# Patient Record
Sex: Female | Born: 1980 | Race: White | Hispanic: No | Marital: Single | Smoking: Never smoker
Health system: Southern US, Community
[De-identification: ages and names within clinical notes are randomized; demographics above are authoritative.]

## PROBLEM LIST (undated history)

## (undated) DIAGNOSIS — A599 Trichomoniasis, unspecified: Secondary | ICD-10-CM

## (undated) DIAGNOSIS — F32A Depression, unspecified: Secondary | ICD-10-CM

## (undated) DIAGNOSIS — Z87440 Personal history of urinary (tract) infections: Secondary | ICD-10-CM

## (undated) DIAGNOSIS — Z8619 Personal history of other infectious and parasitic diseases: Secondary | ICD-10-CM

## (undated) DIAGNOSIS — L409 Psoriasis, unspecified: Secondary | ICD-10-CM

## (undated) DIAGNOSIS — R51 Headache: Secondary | ICD-10-CM

## (undated) DIAGNOSIS — F329 Major depressive disorder, single episode, unspecified: Secondary | ICD-10-CM

## (undated) DIAGNOSIS — T4145XA Adverse effect of unspecified anesthetic, initial encounter: Secondary | ICD-10-CM

## (undated) DIAGNOSIS — F4321 Adjustment disorder with depressed mood: Secondary | ICD-10-CM

## (undated) DIAGNOSIS — IMO0002 Reserved for concepts with insufficient information to code with codable children: Secondary | ICD-10-CM

## (undated) DIAGNOSIS — I209 Angina pectoris, unspecified: Secondary | ICD-10-CM

## (undated) DIAGNOSIS — F4310 Post-traumatic stress disorder, unspecified: Secondary | ICD-10-CM

## (undated) DIAGNOSIS — T8859XA Other complications of anesthesia, initial encounter: Secondary | ICD-10-CM

## (undated) DIAGNOSIS — Z8669 Personal history of other diseases of the nervous system and sense organs: Secondary | ICD-10-CM

## (undated) DIAGNOSIS — B009 Herpesviral infection, unspecified: Secondary | ICD-10-CM

## (undated) DIAGNOSIS — R001 Bradycardia, unspecified: Secondary | ICD-10-CM

## (undated) DIAGNOSIS — Z8719 Personal history of other diseases of the digestive system: Secondary | ICD-10-CM

## (undated) DIAGNOSIS — F419 Anxiety disorder, unspecified: Secondary | ICD-10-CM

## (undated) DIAGNOSIS — Z634 Disappearance and death of family member: Secondary | ICD-10-CM

## (undated) DIAGNOSIS — D649 Anemia, unspecified: Secondary | ICD-10-CM

## (undated) HISTORY — DX: Headache: R51

## (undated) HISTORY — DX: Personal history of urinary (tract) infections: Z87.440

## (undated) HISTORY — DX: Other complications of anesthesia, initial encounter: T88.59XA

## (undated) HISTORY — DX: Depression, unspecified: F32.A

## (undated) HISTORY — DX: Reserved for concepts with insufficient information to code with codable children: IMO0002

## (undated) HISTORY — DX: Personal history of other infectious and parasitic diseases: Z86.19

## (undated) HISTORY — DX: Major depressive disorder, single episode, unspecified: F32.9

## (undated) HISTORY — DX: Herpesviral infection, unspecified: B00.9

## (undated) HISTORY — DX: Disappearance and death of family member: Z63.4

## (undated) HISTORY — DX: Trichomoniasis, unspecified: A59.9

## (undated) HISTORY — PX: WISDOM TOOTH EXTRACTION: SHX21

## (undated) HISTORY — DX: Adjustment disorder with depressed mood: F43.21

## (undated) HISTORY — DX: Adverse effect of unspecified anesthetic, initial encounter: T41.45XA

## (undated) HISTORY — DX: Anemia, unspecified: D64.9

## (undated) HISTORY — DX: Personal history of other diseases of the digestive system: Z87.19

---
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
## 2001-10-26 HISTORY — PX: DILATION AND CURETTAGE OF UTERUS: SHX78

## 2002-12-26 ENCOUNTER — Ambulatory Visit (HOSPITAL_COMMUNITY): Admission: RE | Admit: 2002-12-26 | Discharge: 2002-12-26 | Payer: Self-pay | Admitting: Obstetrics and Gynecology

## 2002-12-26 ENCOUNTER — Encounter: Payer: Self-pay | Admitting: Obstetrics and Gynecology

## 2003-03-22 ENCOUNTER — Inpatient Hospital Stay (HOSPITAL_COMMUNITY): Admission: AD | Admit: 2003-03-22 | Discharge: 2003-03-22 | Payer: Self-pay | Admitting: Obstetrics and Gynecology

## 2003-05-28 ENCOUNTER — Inpatient Hospital Stay (HOSPITAL_COMMUNITY): Admission: EM | Admit: 2003-05-28 | Discharge: 2003-05-30 | Payer: Self-pay | Admitting: Obstetrics and Gynecology

## 2011-08-21 LAB — OB RESULTS CONSOLE ABO/RH

## 2011-08-21 LAB — RUBELLA ANTIBODY, IGM: Rubella: IMMUNE

## 2011-08-21 LAB — OB RESULTS CONSOLE RPR
RPR: NONREACTIVE
RPR: NONREACTIVE

## 2011-08-21 LAB — HIV ANTIBODY (ROUTINE TESTING W REFLEX): HIV: NONREACTIVE

## 2011-10-27 NOTE — L&D Delivery Note (Signed)
Delivery Note Pt's labor has progressed spontaneously without augmentation since admission.  Cx 8 cm around 0445.  Pt did receive a dose of Stadol and a dose of Fentanyl during labor.  Ant rim noted on Rt side around 0515, and it reduced easily after voluntary pushing started.  Cx complete at 0520.  Pushed well to SVD at 5:36 AM.  A viable female "[name]" was delivered via Vaginal, Spontaneous Delivery (Presentation: Left Occiput Anterior). Slight delay in shoulders, but reduced w/ HOB down and McRoberts.  Newborn immediately placed on mom's abdomen, where dried and stimulated, and spontaneous cry thereafter.  APGAR: 9, 9; weight 8 lb 10 oz (3912 g).   Placenta status: Intact, Spontaneous, Tomasa Blase; [place] cord insertion.  Cord: 3 vessels with the following complications: None.  Cord pH: n/a  Anesthesia: None  Episiotomy: None Lacerations: None Suture Repair: n/a Est. Blood Loss (mL): 250  Mom to postpartum.  Baby to nursery-stable. Pt may be interested in early discharge home tomorrow.    Sharon Mckay H 02/29/2012, 6:05 AM

## 2011-12-04 ENCOUNTER — Other Ambulatory Visit: Payer: Self-pay | Admitting: Obstetrics and Gynecology

## 2011-12-04 DIAGNOSIS — Z6791 Unspecified blood type, Rh negative: Secondary | ICD-10-CM

## 2011-12-06 ENCOUNTER — Inpatient Hospital Stay (HOSPITAL_COMMUNITY)
Admission: AD | Admit: 2011-12-06 | Discharge: 2011-12-06 | Disposition: A | Discharge status: Home / Self Care | Payer: Medicaid Other | Source: Ambulatory Visit | Attending: Obstetrics and Gynecology | Admitting: Obstetrics and Gynecology

## 2011-12-06 DIAGNOSIS — Z298 Encounter for other specified prophylactic measures: Secondary | ICD-10-CM | POA: Insufficient documentation

## 2011-12-06 DIAGNOSIS — Z2989 Encounter for other specified prophylactic measures: Secondary | ICD-10-CM | POA: Insufficient documentation

## 2011-12-06 MED ORDER — RHO D IMMUNE GLOBULIN 1500 UNIT/2ML IJ SOLN
300.0000 ug | Freq: Once | INTRAMUSCULAR | Status: AC
  Administered 2011-12-06: 300 ug via INTRAMUSCULAR
  Filled 2011-12-06: qty 2

## 2011-12-06 MED ORDER — RHO D IMMUNE GLOBULIN 300 MCG IM INJ: 300.0000 ug | INJECTION | Freq: Once | INTRAMUSCULAR | Status: DC

## 2011-12-06 NOTE — Progress Notes (Signed)
Pt here for rhyfolac work up

## 2011-12-07 LAB — RH IG WORKUP (INCLUDES ABO/RH)
ABO/RH(D): A NEG
Antibody Screen: NEGATIVE
Unit division: 0

## 2011-12-09 MED ORDER — RHO D IMMUNE GLOBULIN 1500 UNIT/2ML IJ SOLN: 300.0000 ug | Freq: Once | INTRAMUSCULAR | Status: AC

## 2011-12-25 ENCOUNTER — Encounter (INDEPENDENT_AMBULATORY_CARE_PROVIDER_SITE_OTHER): Payer: Medicaid Other

## 2011-12-25 DIAGNOSIS — Z331 Pregnant state, incidental: Secondary | ICD-10-CM

## 2011-12-30 ENCOUNTER — Encounter (INDEPENDENT_AMBULATORY_CARE_PROVIDER_SITE_OTHER): Payer: Medicaid Other | Admitting: Obstetrics and Gynecology

## 2011-12-30 DIAGNOSIS — R87610 Atypical squamous cells of undetermined significance on cytologic smear of cervix (ASC-US): Secondary | ICD-10-CM

## 2012-01-08 ENCOUNTER — Encounter (INDEPENDENT_AMBULATORY_CARE_PROVIDER_SITE_OTHER): Payer: Medicaid Other | Admitting: Obstetrics and Gynecology

## 2012-01-08 DIAGNOSIS — Z23 Encounter for immunization: Secondary | ICD-10-CM

## 2012-01-08 DIAGNOSIS — Z331 Pregnant state, incidental: Secondary | ICD-10-CM

## 2012-01-21 ENCOUNTER — Encounter (INDEPENDENT_AMBULATORY_CARE_PROVIDER_SITE_OTHER): Payer: Medicaid Other | Admitting: Obstetrics and Gynecology

## 2012-01-21 DIAGNOSIS — Z331 Pregnant state, incidental: Secondary | ICD-10-CM

## 2012-01-29 ENCOUNTER — Encounter (INDEPENDENT_AMBULATORY_CARE_PROVIDER_SITE_OTHER): Payer: Medicaid Other | Admitting: Registered Nurse

## 2012-01-29 ENCOUNTER — Other Ambulatory Visit: Payer: Self-pay | Admitting: Registered Nurse

## 2012-01-29 DIAGNOSIS — Z331 Pregnant state, incidental: Secondary | ICD-10-CM

## 2012-01-29 DIAGNOSIS — Z202 Contact with and (suspected) exposure to infections with a predominantly sexual mode of transmission: Secondary | ICD-10-CM

## 2012-01-29 LAB — STREP B DNA PROBE: GBS: NEGATIVE

## 2012-02-01 LAB — HSV 1 ANTIBODY, IGG: HSV 1 Glycoprotein G Ab, IgG: 2.1 IV — ABNORMAL HIGH

## 2012-02-01 LAB — HSV 2 ANTIBODY, IGG: HSV 2 Glycoprotein G Ab, IgG: 2.22 IV — ABNORMAL HIGH

## 2012-02-05 ENCOUNTER — Ambulatory Visit (INDEPENDENT_AMBULATORY_CARE_PROVIDER_SITE_OTHER): Payer: Medicaid Other | Admitting: Registered Nurse

## 2012-02-05 ENCOUNTER — Encounter: Payer: Self-pay | Admitting: Registered Nurse

## 2012-02-05 VITALS — BP 104/62 | Ht 67.0 in | Wt 239.0 lb

## 2012-02-05 DIAGNOSIS — B009 Herpesviral infection, unspecified: Secondary | ICD-10-CM | POA: Insufficient documentation

## 2012-02-05 DIAGNOSIS — Z6711 Type A blood, Rh negative: Secondary | ICD-10-CM

## 2012-02-05 DIAGNOSIS — Z789 Other specified health status: Secondary | ICD-10-CM

## 2012-02-05 DIAGNOSIS — R87811 Vaginal high risk human papillomavirus (HPV) DNA test positive: Secondary | ICD-10-CM

## 2012-02-05 DIAGNOSIS — Z2839 Other underimmunization status: Secondary | ICD-10-CM | POA: Insufficient documentation

## 2012-02-05 DIAGNOSIS — IMO0002 Reserved for concepts with insufficient information to code with codable children: Secondary | ICD-10-CM

## 2012-02-05 DIAGNOSIS — Z283 Underimmunization status: Secondary | ICD-10-CM | POA: Insufficient documentation

## 2012-02-05 DIAGNOSIS — Z331 Pregnant state, incidental: Secondary | ICD-10-CM

## 2012-02-05 DIAGNOSIS — O09899 Supervision of other high risk pregnancies, unspecified trimester: Secondary | ICD-10-CM

## 2012-02-05 MED ORDER — VALACYCLOVIR HCL 1 G PO TABS: 1000.0000 mg | ORAL_TABLET | Freq: Every day | ORAL | Status: DC

## 2012-02-05 NOTE — Progress Notes (Addendum)
Complains of increased pressure. Denies other complaints. 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 Discussed need for cesarean section if outbreak within 10 days of going in to labor. Understanding verbalized. U/S @ NV for growth s>d.

## 2012-02-05 NOTE — Progress Notes (Signed)
Pt states this morning it was very hard for her to sit up and a great amount of pressure was in her lower abdomen, but after she walked around pressed ceased. Pt states she has no concerns. Pt declines cervix check.

## 2012-02-11 DIAGNOSIS — B977 Papillomavirus as the cause of diseases classified elsewhere: Secondary | ICD-10-CM | POA: Insufficient documentation

## 2012-02-11 DIAGNOSIS — Z6711 Type A blood, Rh negative: Secondary | ICD-10-CM

## 2012-02-12 ENCOUNTER — Encounter: Payer: Self-pay | Admitting: Obstetrics and Gynecology

## 2012-02-12 ENCOUNTER — Encounter: Payer: Medicaid Other | Admitting: Obstetrics and Gynecology

## 2012-02-12 ENCOUNTER — Other Ambulatory Visit: Payer: Self-pay | Admitting: Registered Nurse

## 2012-02-12 ENCOUNTER — Ambulatory Visit (INDEPENDENT_AMBULATORY_CARE_PROVIDER_SITE_OTHER): Payer: Medicaid Other | Admitting: Obstetrics and Gynecology

## 2012-02-12 ENCOUNTER — Ambulatory Visit (INDEPENDENT_AMBULATORY_CARE_PROVIDER_SITE_OTHER): Payer: Medicaid Other

## 2012-02-12 VITALS — BP 106/64 | Wt 241.0 lb

## 2012-02-12 DIAGNOSIS — Z331 Pregnant state, incidental: Secondary | ICD-10-CM

## 2012-02-12 DIAGNOSIS — O09899 Supervision of other high risk pregnancies, unspecified trimester: Secondary | ICD-10-CM

## 2012-02-12 DIAGNOSIS — R87811 Vaginal high risk human papillomavirus (HPV) DNA test positive: Secondary | ICD-10-CM

## 2012-02-12 DIAGNOSIS — IMO0002 Reserved for concepts with insufficient information to code with codable children: Secondary | ICD-10-CM

## 2012-02-12 DIAGNOSIS — Z283 Underimmunization status: Secondary | ICD-10-CM

## 2012-02-12 NOTE — Progress Notes (Signed)
No complaints  Ultrasound shows:  EFW: 7 lbs 10 oz  66%           AFI: normal           Cervical length: not done              Fetal presentation: cephalic

## 2012-02-12 NOTE — Progress Notes (Signed)
Pt. Stated no issues today.  

## 2012-02-19 ENCOUNTER — Ambulatory Visit (INDEPENDENT_AMBULATORY_CARE_PROVIDER_SITE_OTHER): Payer: Medicaid Other | Admitting: Obstetrics and Gynecology

## 2012-02-19 ENCOUNTER — Encounter: Payer: Self-pay | Admitting: Obstetrics and Gynecology

## 2012-02-19 VITALS — BP 110/80 | Wt 239.0 lb

## 2012-02-19 DIAGNOSIS — Z331 Pregnant state, incidental: Secondary | ICD-10-CM

## 2012-02-19 NOTE — Progress Notes (Signed)
No complaints

## 2012-02-25 ENCOUNTER — Ambulatory Visit (INDEPENDENT_AMBULATORY_CARE_PROVIDER_SITE_OTHER): Payer: Medicaid Other | Admitting: Obstetrics and Gynecology

## 2012-02-25 VITALS — BP 110/72 | Wt 244.0 lb

## 2012-02-25 DIAGNOSIS — Z01419 Encounter for gynecological examination (general) (routine) without abnormal findings: Secondary | ICD-10-CM

## 2012-02-25 DIAGNOSIS — Z331 Pregnant state, incidental: Secondary | ICD-10-CM

## 2012-02-25 NOTE — Progress Notes (Signed)
Pt. Stated no issues today. Pt wants cervix check today.

## 2012-02-25 NOTE — Progress Notes (Signed)
Some pressure.No change in discharge.Reviewed R&B of IOL. Pt desires to wait for now. Follow-up in 1 week with [facility]

## 2012-02-26 ENCOUNTER — Encounter: Payer: Medicaid Other | Admitting: Obstetrics and Gynecology

## 2012-02-26 ENCOUNTER — Telehealth: Payer: Self-pay | Admitting: Obstetrics and Gynecology

## 2012-02-28 ENCOUNTER — Telehealth: Payer: Self-pay

## 2012-02-28 NOTE — Telephone Encounter (Signed)
Pt calling to report 40.3 weeks, and having irregular ctxs since 2000.  No LOF or VB.  GFM.  G4P2.  Reports cervix still closed on 5/2.  GBS neg.  Reports ctxs are every "5 to 7 to 10 min."  Offered MAU eval now, or continue observing at home until more regular and/or more painful.  She desires to continue to observe at home for now, but will f/u prn.   Has next appt Friday 5/10 if no spontaneous labor.

## 2012-02-29 ENCOUNTER — Encounter (HOSPITAL_COMMUNITY): Payer: Self-pay | Admitting: *Deleted

## 2012-02-29 ENCOUNTER — Inpatient Hospital Stay (HOSPITAL_COMMUNITY)
Admission: AD | Admit: 2012-02-29 | Discharge: 2012-03-02 | DRG: 774 | Disposition: A | Discharge status: Home / Self Care | Payer: Medicaid Other | Source: Ambulatory Visit | Attending: Obstetrics and Gynecology | Admitting: Obstetrics and Gynecology

## 2012-02-29 DIAGNOSIS — Z059 Observation and evaluation of newborn for unspecified suspected condition ruled out: Secondary | ICD-10-CM

## 2012-02-29 DIAGNOSIS — IMO0001 Reserved for inherently not codable concepts without codable children: Secondary | ICD-10-CM

## 2012-02-29 DIAGNOSIS — Z8669 Personal history of other diseases of the nervous system and sense organs: Secondary | ICD-10-CM

## 2012-02-29 DIAGNOSIS — O3440 Maternal care for other abnormalities of cervix, unspecified trimester: Secondary | ICD-10-CM

## 2012-02-29 DIAGNOSIS — D649 Anemia, unspecified: Secondary | ICD-10-CM | POA: Diagnosis not present

## 2012-02-29 DIAGNOSIS — Z0541 Observation and evaluation of newborn for suspected genetic condition ruled out: Secondary | ICD-10-CM

## 2012-02-29 DIAGNOSIS — IMO0002 Reserved for concepts with insufficient information to code with codable children: Secondary | ICD-10-CM | POA: Insufficient documentation

## 2012-02-29 DIAGNOSIS — O98519 Other viral diseases complicating pregnancy, unspecified trimester: Secondary | ICD-10-CM | POA: Diagnosis present

## 2012-02-29 DIAGNOSIS — A6000 Herpesviral infection of urogenital system, unspecified: Secondary | ICD-10-CM | POA: Diagnosis present

## 2012-02-29 DIAGNOSIS — Z9889 Other specified postprocedural states: Secondary | ICD-10-CM

## 2012-02-29 DIAGNOSIS — O9903 Anemia complicating the puerperium: Principal | ICD-10-CM | POA: Diagnosis not present

## 2012-02-29 HISTORY — DX: Personal history of other diseases of the nervous system and sense organs: Z86.69

## 2012-02-29 HISTORY — DX: Other specified postprocedural states: Z98.890

## 2012-02-29 HISTORY — DX: Reserved for concepts with insufficient information to code with codable children: IMO0002

## 2012-02-29 HISTORY — DX: Maternal care for other abnormalities of cervix, unspecified trimester: O34.40

## 2012-02-29 LAB — CBC
HCT: 34.2 % — ABNORMAL LOW (ref 36.0–46.0)
Hemoglobin: 11.3 g/dL — ABNORMAL LOW (ref 12.0–15.0)
MCHC: 33 g/dL (ref 30.0–36.0)
RDW: 14.3 % (ref 11.5–15.5)
WBC: 9 10*3/uL (ref 4.0–10.5)

## 2012-02-29 LAB — RPR: RPR Ser Ql: NONREACTIVE

## 2012-02-29 MED ORDER — IBUPROFEN 600 MG PO TABS
600.0000 mg | ORAL_TABLET | Freq: Four times a day (QID) | ORAL | Status: DC
  Administered 2012-02-29 – 2012-03-02 (×8): 600 mg via ORAL
  Filled 2012-02-29 (×8): qty 1

## 2012-02-29 MED ORDER — PRENATAL MULTIVITAMIN CH
1.0000 | ORAL_TABLET | Freq: Every day | ORAL | Status: DC
  Administered 2012-02-29 – 2012-03-02 (×3): 1 via ORAL
  Filled 2012-02-29 (×3): qty 1

## 2012-02-29 MED ORDER — SIMETHICONE 80 MG PO CHEW: 80.0000 mg | CHEWABLE_TABLET | ORAL | Status: DC | PRN

## 2012-02-29 MED ORDER — HYDROXYZINE HCL 50 MG/ML IM SOLN: 50.0000 mg | Freq: Four times a day (QID) | INTRAMUSCULAR | Status: DC | PRN

## 2012-02-29 MED ORDER — MEDROXYPROGESTERONE ACETATE 150 MG/ML IM SUSP: 150.0000 mg | INTRAMUSCULAR | Status: DC | PRN

## 2012-02-29 MED ORDER — LACTATED RINGERS IV SOLN
INTRAVENOUS | Status: DC
Start: 2012-02-29 — End: 2012-02-29
  Administered 2012-02-29: 02:00:00 via INTRAVENOUS

## 2012-02-29 MED ORDER — LIDOCAINE HCL (PF) 1 % IJ SOLN: 30.0000 mL | INTRAMUSCULAR | Status: DC | PRN

## 2012-02-29 MED ORDER — ONDANSETRON HCL 4 MG PO TABS: 4.0000 mg | ORAL_TABLET | ORAL | Status: DC | PRN

## 2012-02-29 MED ORDER — OXYTOCIN 20 UNITS IN LACTATED RINGERS INFUSION - SIMPLE
125.0000 mL/h | Freq: Once | INTRAVENOUS | Status: AC
  Administered 2012-02-29: 125 mL/h via INTRAVENOUS

## 2012-02-29 MED ORDER — ZOLPIDEM TARTRATE 5 MG PO TABS: 5.0000 mg | ORAL_TABLET | Freq: Every evening | ORAL | Status: DC | PRN

## 2012-02-29 MED ORDER — BENZOCAINE-MENTHOL 20-0.5 % EX AERO: 1.0000 "application " | INHALATION_SPRAY | CUTANEOUS | Status: DC | PRN

## 2012-02-29 MED ORDER — PHENYLEPHRINE 40 MCG/ML (10ML) SYRINGE FOR IV PUSH (FOR BLOOD PRESSURE SUPPORT): 80.0000 ug | PREFILLED_SYRINGE | INTRAVENOUS | Status: DC | PRN

## 2012-02-29 MED ORDER — LACTATED RINGERS IV SOLN: 500.0000 mL | Freq: Once | INTRAVENOUS | Status: DC

## 2012-02-29 MED ORDER — CITRIC ACID-SODIUM CITRATE 334-500 MG/5ML PO SOLN: 30.0000 mL | ORAL | Status: DC | PRN

## 2012-02-29 MED ORDER — FENTANYL 2.5 MCG/ML BUPIVACAINE 1/10 % EPIDURAL INFUSION (WH - ANES): 14.0000 mL/h | INTRAMUSCULAR | Status: DC

## 2012-02-29 MED ORDER — ONDANSETRON HCL 4 MG/2ML IJ SOLN: 4.0000 mg | INTRAMUSCULAR | Status: DC | PRN

## 2012-02-29 MED ORDER — BUTORPHANOL TARTRATE 2 MG/ML IJ SOLN
1.0000 mg | INTRAMUSCULAR | Status: DC | PRN
  Administered 2012-02-29: 1 mg via INTRAVENOUS
  Filled 2012-02-29: qty 1

## 2012-02-29 MED ORDER — FLEET ENEMA 7-19 GM/118ML RE ENEM: 1.0000 | ENEMA | RECTAL | Status: DC | PRN

## 2012-02-29 MED ORDER — LACTATED RINGERS IV SOLN: 500.0000 mL | INTRAVENOUS | Status: DC | PRN

## 2012-02-29 MED ORDER — SENNOSIDES-DOCUSATE SODIUM 8.6-50 MG PO TABS
2.0000 | ORAL_TABLET | Freq: Every day | ORAL | Status: DC
  Administered 2012-02-29 – 2012-03-01 (×2): 2 via ORAL

## 2012-02-29 MED ORDER — DIPHENHYDRAMINE HCL 25 MG PO CAPS
25.0000 mg | ORAL_CAPSULE | Freq: Four times a day (QID) | ORAL | Status: DC | PRN
Start: 2012-02-29 — End: 2012-03-02

## 2012-02-29 MED ORDER — EPHEDRINE 5 MG/ML INJ: 10.0000 mg | INTRAVENOUS | Status: DC | PRN

## 2012-02-29 MED ORDER — HYDROXYZINE HCL 50 MG PO TABS: 50.0000 mg | ORAL_TABLET | Freq: Four times a day (QID) | ORAL | Status: DC | PRN

## 2012-02-29 MED ORDER — OXYTOCIN BOLUS FROM INFUSION
500.0000 mL | Freq: Once | INTRAVENOUS | Status: DC
  Filled 2012-02-29: qty 500
  Filled 2012-02-29: qty 1000

## 2012-02-29 MED ORDER — TETANUS-DIPHTH-ACELL PERTUSSIS 5-2.5-18.5 LF-MCG/0.5 IM SUSP
0.5000 mL | Freq: Once | INTRAMUSCULAR | Status: AC
  Administered 2012-03-01: 0.5 mL via INTRAMUSCULAR
  Filled 2012-02-29: qty 0.5

## 2012-02-29 MED ORDER — LANOLIN HYDROUS EX OINT: TOPICAL_OINTMENT | CUTANEOUS | Status: DC | PRN

## 2012-02-29 MED ORDER — OXYCODONE-ACETAMINOPHEN 5-325 MG PO TABS: 1.0000 | ORAL_TABLET | ORAL | Status: DC | PRN

## 2012-02-29 MED ORDER — ONDANSETRON HCL 4 MG/2ML IJ SOLN: 4.0000 mg | Freq: Four times a day (QID) | INTRAMUSCULAR | Status: DC | PRN

## 2012-02-29 MED ORDER — WITCH HAZEL-GLYCERIN EX PADS: 1.0000 "application " | MEDICATED_PAD | CUTANEOUS | Status: DC | PRN

## 2012-02-29 MED ORDER — DIBUCAINE 1 % RE OINT: 1.0000 "application " | TOPICAL_OINTMENT | RECTAL | Status: DC | PRN

## 2012-02-29 MED ORDER — MAGNESIUM HYDROXIDE 400 MG/5ML PO SUSP: 30.0000 mL | ORAL | Status: DC | PRN

## 2012-02-29 MED ORDER — IBUPROFEN 600 MG PO TABS
600.0000 mg | ORAL_TABLET | Freq: Four times a day (QID) | ORAL | Status: DC | PRN
  Administered 2012-02-29: 600 mg via ORAL
  Filled 2012-02-29: qty 1

## 2012-02-29 MED ORDER — OXYCODONE-ACETAMINOPHEN 5-325 MG PO TABS
1.0000 | ORAL_TABLET | ORAL | Status: DC | PRN
Start: 2012-02-29 — End: 2012-03-02
  Administered 2012-02-29 – 2012-03-01 (×3): 1 via ORAL
  Administered 2012-03-01: 2 via ORAL
  Administered 2012-03-02: 1 via ORAL
  Filled 2012-02-29: qty 1
  Filled 2012-02-29: qty 2
  Filled 2012-02-29 (×3): qty 1

## 2012-02-29 MED ORDER — DIPHENHYDRAMINE HCL 50 MG/ML IJ SOLN: 12.5000 mg | INTRAMUSCULAR | Status: DC | PRN

## 2012-02-29 MED ORDER — ACETAMINOPHEN 325 MG PO TABS: 650.0000 mg | ORAL_TABLET | ORAL | Status: DC | PRN

## 2012-02-29 MED ORDER — FENTANYL CITRATE 0.05 MG/ML IJ SOLN
100.0000 ug | INTRAMUSCULAR | Status: DC | PRN
  Administered 2012-02-29: 100 ug via INTRAVENOUS
  Filled 2012-02-29: qty 2

## 2012-02-29 NOTE — MAU Note (Signed)
PT SAYS STARTED HURTING AT 830PM.  VE IN OFFICE  CLOSED. DENIES HSV AND MRSA.Sharon Mckay   LABS  SHOWED POSTIVE FOR HSV- PT TAKING VALTREX
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
## 2012-02-29 NOTE — Progress Notes (Signed)
Subjective: Following transfer to L&D, decided against epidural.  Desires cervical check now.  S.o. And a female visitor are at bedside.  Pt further discussing w/ myself and her RN lesion w/ scarring on RLQ--told not MRSA, but was "staph infection."  States has healed during pregnancy and keeps feeling like infection still under skin.    Objective: BP 152/93  Pulse 99  Temp(Src) 98.5 F (36.9 C) (Oral)  Resp 18  Ht 5\' 7"  (1.702 m)  Wt 111.131 kg (245 lb)  BMI 38.37 kg/m2  LMP 05/21/2011      FHT:  FHR: 120 bpm, variability: minimal ,  accelerations:  Abscent,  decelerations:  Absent UC:   regular, every 2-4 minutes couplet pattern at times SVE:   5/80/-1; posterior to pt's Lt  Labs: Lab Results  Component Value Date   WBC 9.0 02/29/2012   HGB 11.3* 02/29/2012   HCT 34.2* 02/29/2012   MCV 88.4 02/29/2012   PLT 207 02/29/2012    Assessment / Plan: 1. 40.4  2.  active labor  3.  GBS neg  4. one elevated BP since in L&D  Labor: Progressing normally Preeclampsia:  no signs or symptoms of toxicity Fetal Wellbeing:  Category I Pain Control:  Labor support without medications I/D:  n/a Anticipated MOD:  NSVD 1.  Will CTO BP and FHT closely 2.  Support as needed 3.  Pitocin prn augmentation Bain Whichard H 02/29/2012, 3:15 AM

## 2012-02-29 NOTE — Progress Notes (Signed)
CNM shown spots on pt abdomen, cnm says she does not want to swab pt for mrsa at this time

## 2012-02-29 NOTE — Progress Notes (Signed)
Steelman CNM at bedside. Ok to give meds at this time

## 2012-02-29 NOTE — Progress Notes (Signed)
UR chart review completed.  

## 2012-03-01 LAB — CBC
Hemoglobin: 9.5 g/dL — ABNORMAL LOW (ref 12.0–15.0)
MCH: 29 pg (ref 26.0–34.0)
MCV: 90.5 fL (ref 78.0–100.0)
RBC: 3.28 MIL/uL — ABNORMAL LOW (ref 3.87–5.11)

## 2012-03-01 NOTE — Progress Notes (Deleted)
Pt discharged before Sw could assess. 

## 2012-03-01 NOTE — Progress Notes (Signed)
03/01/12 1315  Clinical Encounter Type  Visited With Patient and family together  Visit Type Spiritual support  Referral From Nurse  Spiritual Encounters  Spiritual Needs Emotional  Stress Factors  Patient Stress Factors (Per RN, past loss of baby due to SIDS at 4 months)    Introduced to Ms Drozdowski and family by Vikki Ports, pt's bedside RN, to offer emotional and spiritual support, as pt was tearful after baby Rosalie (spelling?) taken to NICU unexpectedly.  Ms Schimek did not share about the loss of her other baby, but did express concern for her 107-year-old daughter at home, and how to cope with separation and what to tell her about baby's condition and needs.  Pt stressed, tearful, tired.  Family departed to allow pt to rest.  Offered chaplain services throughout pt's and baby's stays, including in NICU, to whole family.  Provided empathic listening and affirmation.  Avis Epley, [place] Chaplain [phone]

## 2012-03-01 NOTE — Progress Notes (Signed)
Post Partum Day 1 Subjective: voiding, tolerating PO and Worried about her baby seems to have oxygenation issues. She has a history of a prior infant with a SIDS death.  Objective: Blood pressure 109/75, pulse 80, temperature 97.9 F (36.6 C), temperature source Oral, resp. rate 18, height 5\' 7"  (1.702 m), weight 111.131 kg (245 lb), last menstrual period 05/21/2011, SpO2 98.00%, unknown if currently breastfeeding.  Physical Exam:  General: fatigued Lochia: appropriate Uterine Fundus: firm Incision: NA DVT Evaluation: No evidence of DVT seen on physical exam.   Basename 03/01/12 0550 02/29/12 0149  HGB 9.5* 11.3*  HCT 29.7* 34.2*    Assessment/Plan: Plan for discharge tomorrow The baby will be observed closely. A chest x-ray today was normal. An EKG and echo is scheduled for later today. Anemia   LOS: 1 day   Semaje Kinker V 03/01/2012, 9:05 AM

## 2012-03-01 NOTE — Progress Notes (Signed)
Call from Dr Mikle Bosworth. Baby was transferred to NICU because failed Congenital Heart Disease Screen with increased risk of Coarctation of aorta. Will be in NICU awaiting closure of Ductus Arteriosus ( expected in 2-3 days) so echocardiogram can be performed to rule-out CA.

## 2012-03-01 NOTE — Progress Notes (Signed)
S: comfortable, little bleeding, slept little, bottle     feeding O VSS     abd soft, nt, ff      sm  flowperineum clean intact     -Homans sign bilaterally,       No edema BP 109/75  Pulse 80  Temp(Src) 97.9 F (36.6 C) (Oral)  Resp 18  Ht 5\' 7"  (1.702 m)  Wt 245 lb (111.131 kg)  BMI 38.37 kg/m2  SpO2 98%  LMP 05/21/2011  Breastfeeding? Unknown Hemoglobin & Hematocrit     Component Value Date/Time   HGB 9.5* 03/01/2012 0550   HCT 29.7* 03/01/2012 0550   A normal involution     Nonactating     Pp day 1     anemia P continue care, plans [facility] Lavera Guise, CNM

## 2012-03-02 DIAGNOSIS — Z059 Observation and evaluation of newborn for unspecified suspected condition ruled out: Secondary | ICD-10-CM

## 2012-03-02 MED ORDER — OXYCODONE-ACETAMINOPHEN 5-325 MG PO TABS: 1.0000 | ORAL_TABLET | ORAL | Status: AC | PRN

## 2012-03-02 MED ORDER — IBUPROFEN 600 MG PO TABS: 600.0000 mg | ORAL_TABLET | Freq: Four times a day (QID) | ORAL | Status: AC | PRN

## 2012-03-02 MED ORDER — NORGESTIM-ETH ESTRAD TRIPHASIC 0.18/0.215/0.25 MG-35 MCG PO TABS: 1.0000 | ORAL_TABLET | Freq: Every day | ORAL | Status: DC

## 2012-03-02 MED ORDER — RHO D IMMUNE GLOBULIN 1500 UNIT/2ML IJ SOLN
300.0000 ug | Freq: Once | INTRAMUSCULAR | Status: AC
  Administered 2012-03-02: 300 ug via INTRAMUSCULAR
  Filled 2012-03-02: qty 2

## 2012-03-02 NOTE — Discharge Summary (Signed)
Obstetric Discharge Summary Reason for Admission: onset of labor and rupture of membranes Prenatal Procedures: NST, CST, Preeclampsia and ultrasound Intrapartum Procedures: spontaneous vaginal delivery Postpartum Procedures: Rho(D) Ig Complications-Operative and Postpartum: Newborn being worked up for possible coarctation of aorta Hemoglobin  Date Value Range Status  03/01/2012 9.5* 12.0-15.0 (g/dL) Final     HCT  Date Value Range Status  03/01/2012 29.7* 36.0-46.0 (%) Final   Hospital Course: Admitted in active labor on 02/29/12, with SROM on way to hospital. Negative GBS. Progressed spontaneously to delivery, with IV meds for pain. Delivery was performed Denny Levy, CNM, without complication. Patient and baby tolerated the procedure without difficulty, with  no lacerations noted. Infant to FTN. Mother had an uncomplicated postpartum course.  Baby had an episode of increased chest congestion, with negative CXR, but evidence of variation in O2 sats by location of assessment.  Baby is currently in NICU, undergoing evaluation for coarctation of the aorta.  Mom is bottlefeeding.  Mom's physical exam was WNL, and she was discharged home in stable condition. Contraception plan was Ortho-Tricyclen, with patient preferring brand name.  She received adequate benefit from po pain medications, and was using Motrin and Percocet.      Physical Exam:  General: alert Lochia: appropriate Uterine Fundus: firm Incision: Clear DVT Evaluation: No evidence of DVT seen on physical exam. Negative Homan's sign.  Discharge Diagnoses: Term Pregnancy-delivered  Discharge Information: Date: 03/02/2012 Activity: Per [facility] handout Diet: routine Medications: Ibuprofen, Percocet and Ortho-Tricyclen brand name Condition: stable Instructions: refer to practice specific booklet Discharge to: home Contraception:  Ortho-Tricyclen brand name Follow-up Information    Follow up with [facility] in 6 weeks.  (As scheduled or as needed)          Newborn Data: Live born female  Birth Weight: 8 lb 10 oz (3912 g) APGAR: 9, 9  Remains in NICU  LATHAM, VICKI, CNM, [place] 03/02/2012, 9:04 AM

## 2012-03-02 NOTE — Discharge Instructions (Signed)
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
## 2012-03-03 LAB — RH IG WORKUP (INCLUDES ABO/RH)
Fetal Screen: NEGATIVE
Gestational Age(Wks): 40

## 2012-03-04 ENCOUNTER — Encounter: Payer: Medicaid Other | Admitting: Obstetrics and Gynecology

## 2012-03-04 ENCOUNTER — Other Ambulatory Visit: Payer: Medicaid Other

## 2012-03-11 ENCOUNTER — Telehealth: Payer: Self-pay | Admitting: Obstetrics and Gynecology

## 2012-03-11 NOTE — Telephone Encounter (Signed)
Triage/epic 

## 2012-03-11 NOTE — Telephone Encounter (Signed)
Tc to pt. Pt c/o herpes outbreak since yesterday. Pt taking Valtrex 1000mg  daily since 3rd trimester of pregnancy. Pt delivered 02-29-12. Consulted with mk, pt to cont regimen of Valtrex as directed. Pt voices understanding.

## 2012-03-11 NOTE — Telephone Encounter (Signed)
TO CW

## 2012-04-06 ENCOUNTER — Ambulatory Visit: Payer: Medicaid Other | Admitting: Obstetrics and Gynecology

## 2012-04-21 ENCOUNTER — Ambulatory Visit (INDEPENDENT_AMBULATORY_CARE_PROVIDER_SITE_OTHER): Payer: Medicaid Other | Admitting: Obstetrics and Gynecology

## 2012-04-21 ENCOUNTER — Encounter: Payer: Self-pay | Admitting: Obstetrics and Gynecology

## 2012-04-21 MED ORDER — VALACYCLOVIR HCL 1 G PO TABS: 500.0000 mg | ORAL_TABLET | Freq: Every day | ORAL | Status: DC

## 2012-04-21 NOTE — Progress Notes (Signed)
Sharon Mckay  is 7 weeks postpartum following a spontaneous vaginal delivery at 40 gestational weeks Date: 02/29/12 female baby named [name] delivered by [facility].  Breastfeeding: no Bottlefeeding:  yes  Post-partum blues / depression:  no  EPDS score: 10  History of abnormal Pap:  yes  Last Pap: Date  09/25/11, ASCUS  Colpo 12/12: CIN 1 Gestational diabetes:  no  Contraception: Pt is currently taking orthotricyclen  Normal urinary function:  yes Normal GI function:  yes Returning to work:  no   Pt would like rx to take Valtrex every day for suppression instead of only taking it during outbreaks.    Subjective:     Sharon Mckay is a 31 y.o. female who presents for a postpartum visit.  I have fully reviewed the prenatal and intrapartum course.     Patient is sexually active.   The following portions of the patient's history were reviewed and updated as appropriate: allergies, current medications, past family history, past medical history, past social history, past surgical history and problem list.  Review of Systems Pertinent items are noted in HPI.   Objective:    BP 110/68  Temp 98.6 F (37 C) (Oral)  Wt 227 lb (102.967 kg)  LMP 04/04/2012  Breastfeeding? No  General:  alert, cooperative and no distress     Lungs: clear to auscultation bilaterally  Heart:  regular rate and rhythm, S1, S2 normal, no murmur  Abdomen: soft, non-tender; bowel sounds normal; no masses,  no organomegaly   Vulva:  normal  Vagina: normal vagina  Cervix:  normal  Corpus: normal size, contour, position, consistency, mobility, non-tender  Adnexa:  normal adnexa             Assessment:     Normal postpartum exam.  CIN 1 12/12 Pap smear done at today's visit.   Plan:    follow-up 4 months for Pap  Bindu Docter A MD 04/21/2012 12:15 PM

## 2012-04-25 LAB — PAP IG W/ RFLX HPV ASCU

## 2012-04-29 ENCOUNTER — Other Ambulatory Visit: Payer: Self-pay | Admitting: Obstetrics and Gynecology

## 2012-04-29 NOTE — Telephone Encounter (Signed)
Pt seen 04-21-12 by SR for PPV.  Pt requested Valtrex for continual use instead of just breakout. Pt is now calling for Rx.  No Rx seen in chart.  Will send to SR for OK.  Pt has med to take if needed, and will notify pt when ready.   ld

## 2012-05-02 MED ORDER — VALACYCLOVIR HCL 1 G PO TABS: 500.0000 mg | ORAL_TABLET | Freq: Every day | ORAL | Status: AC

## 2012-05-02 NOTE — Telephone Encounter (Signed)
Pt notified of rx sent to [facility]  ld

## 2012-05-02 NOTE — Telephone Encounter (Signed)
OK to call in Valtrex 500 mg daily  #90 with 3 refills

## 2012-05-03 ENCOUNTER — Other Ambulatory Visit: Payer: Self-pay | Admitting: Obstetrics and Gynecology

## 2012-05-03 NOTE — Telephone Encounter (Signed)
Verified instructions with [facility]  ld

## 2012-05-03 NOTE — Telephone Encounter (Signed)
Laura/pharm/rx

## 2014-08-27 ENCOUNTER — Encounter: Payer: Self-pay | Admitting: Obstetrics and Gynecology

## 2018-10-26 NOTE — L&D Delivery Note (Signed)
Delivery Note At  a viable female was delivered via  (Presentation:ROA ;  ).  APGAR:8 ,9 ; weight pending  .   Placenta status:complete , .  3V Cord:  with the following complications: none.    Anesthesia:  None Episiotomy:  None Lacerations:  None Suture Repair: N/A Est. Blood Loss (mL):  150cc  Mom to postpartum.  Baby to Couplet care / Skin to Skin.  Pleas Koch Kacie Huxtable 09/16/2019, 10:11 PM

## 2019-02-13 LAB — CHG URINE PREGNANCY TEST: Preg Test, Ur: POSITIVE

## 2019-03-07 ENCOUNTER — Encounter: Payer: Self-pay | Admitting: *Deleted

## 2019-03-07 ENCOUNTER — Telehealth: Payer: Self-pay | Admitting: Family Medicine

## 2019-03-07 NOTE — Telephone Encounter (Signed)
Called patient to get her scheduled for her intake appointment. She stated she wasn't sure if she was coming to this office, or going back to [facility] where she was seen for her last pregnancy. She stated she was getting her medicaid, and then would be calling them to get scheduled. She also stated she may call us back, but she would have to request two weeks notice to be off for an appointment.

## 2019-03-14 ENCOUNTER — Encounter: Payer: Self-pay | Admitting: Student

## 2019-05-16 LAB — OB RESULTS CONSOLE HEPATITIS B SURFACE ANTIGEN: Hepatitis B Surface Ag: NEGATIVE

## 2019-05-23 LAB — OB RESULTS CONSOLE GC/CHLAMYDIA
Chlamydia: NEGATIVE
Gonorrhea: NEGATIVE

## 2019-06-21 LAB — OB RESULTS CONSOLE HIV ANTIBODY (ROUTINE TESTING): HIV: NONREACTIVE

## 2019-06-21 LAB — OB RESULTS CONSOLE RPR: RPR: NONREACTIVE

## 2019-08-23 LAB — OB RESULTS CONSOLE GBS: GBS: NEGATIVE

## 2019-08-24 LAB — OB RESULTS CONSOLE GC/CHLAMYDIA
Chlamydia: NEGATIVE
Gonorrhea: NEGATIVE

## 2019-09-15 ENCOUNTER — Encounter (HOSPITAL_COMMUNITY): Payer: Self-pay | Admitting: *Deleted

## 2019-09-15 ENCOUNTER — Other Ambulatory Visit: Payer: Self-pay

## 2019-09-15 ENCOUNTER — Inpatient Hospital Stay (HOSPITAL_COMMUNITY)
Admission: AD | Admit: 2019-09-15 | Discharge: 2019-09-18 | DRG: 806 | Disposition: A | Discharge status: Home / Self Care | Payer: Medicaid Other | Attending: Obstetrics and Gynecology | Admitting: Obstetrics and Gynecology

## 2019-09-15 DIAGNOSIS — Z3490 Encounter for supervision of normal pregnancy, unspecified, unspecified trimester: Secondary | ICD-10-CM | POA: Diagnosis present

## 2019-09-15 DIAGNOSIS — A6000 Herpesviral infection of urogenital system, unspecified: Secondary | ICD-10-CM | POA: Diagnosis present

## 2019-09-15 DIAGNOSIS — O9832 Other infections with a predominantly sexual mode of transmission complicating childbirth: Secondary | ICD-10-CM | POA: Diagnosis present

## 2019-09-15 DIAGNOSIS — O480 Post-term pregnancy: Secondary | ICD-10-CM | POA: Diagnosis present

## 2019-09-15 DIAGNOSIS — Z20828 Contact with and (suspected) exposure to other viral communicable diseases: Secondary | ICD-10-CM | POA: Diagnosis present

## 2019-09-15 DIAGNOSIS — Z3A40 40 weeks gestation of pregnancy: Secondary | ICD-10-CM

## 2019-09-15 HISTORY — DX: Psoriasis, unspecified: L40.9

## 2019-09-15 LAB — CBC
HCT: 33.3 % — ABNORMAL LOW (ref 36.0–46.0)
Hemoglobin: 10.7 g/dL — ABNORMAL LOW (ref 12.0–15.0)
MCH: 29.6 pg (ref 26.0–34.0)
MCHC: 32.1 g/dL (ref 30.0–36.0)
MCV: 92 fL (ref 80.0–100.0)
Platelets: 195 10*3/uL (ref 150–400)
RBC: 3.62 MIL/uL — ABNORMAL LOW (ref 3.87–5.11)
RDW: 13.8 % (ref 11.5–15.5)
WBC: 7.3 10*3/uL (ref 4.0–10.5)
nRBC: 0 % (ref 0.0–0.2)

## 2019-09-15 LAB — SARS CORONAVIRUS 2 (TAT 6-24 HRS): SARS Coronavirus 2: NEGATIVE

## 2019-09-15 MED ORDER — LACTATED RINGERS IV SOLN
INTRAVENOUS | Status: DC
  Administered 2019-09-15 – 2019-09-16 (×4): via INTRAVENOUS

## 2019-09-15 MED ORDER — MISOPROSTOL 25 MCG QUARTER TABLET
ORAL_TABLET | ORAL | Status: AC
  Filled 2019-09-15: qty 1

## 2019-09-15 MED ORDER — OXYCODONE-ACETAMINOPHEN 5-325 MG PO TABS: 1.0000 | ORAL_TABLET | ORAL | Status: DC | PRN

## 2019-09-15 MED ORDER — OXYCODONE-ACETAMINOPHEN 5-325 MG PO TABS: 2.0000 | ORAL_TABLET | ORAL | Status: DC | PRN

## 2019-09-15 MED ORDER — TERBUTALINE SULFATE 1 MG/ML IJ SOLN: 0.2500 mg | Freq: Once | INTRAMUSCULAR | Status: DC | PRN

## 2019-09-15 MED ORDER — OXYTOCIN BOLUS FROM INFUSION: 500.0000 mL | Freq: Once | INTRAVENOUS | Status: DC

## 2019-09-15 MED ORDER — LACTATED RINGERS IV SOLN
INTRAVENOUS | Status: DC
  Administered 2019-09-15: 11:00:00 via INTRAVENOUS

## 2019-09-15 MED ORDER — FLEET ENEMA 7-19 GM/118ML RE ENEM: 1.0000 | ENEMA | Freq: Every day | RECTAL | Status: DC | PRN

## 2019-09-15 MED ORDER — SOD CITRATE-CITRIC ACID 500-334 MG/5ML PO SOLN: 30.0000 mL | ORAL | Status: DC | PRN

## 2019-09-15 MED ORDER — OXYTOCIN 40 UNITS IN NORMAL SALINE INFUSION - SIMPLE MED: 2.5000 [IU]/h | INTRAVENOUS | Status: DC

## 2019-09-15 MED ORDER — OXYTOCIN BOLUS FROM INFUSION
500.0000 mL | Freq: Once | INTRAVENOUS | Status: AC
  Administered 2019-09-16: 500 mL via INTRAVENOUS

## 2019-09-15 MED ORDER — ACETAMINOPHEN 325 MG PO TABS: 650.0000 mg | ORAL_TABLET | ORAL | Status: DC | PRN

## 2019-09-15 MED ORDER — LACTATED RINGERS IV SOLN
500.0000 mL | INTRAVENOUS | Status: DC | PRN
  Administered 2019-09-16: 500 mL via INTRAVENOUS

## 2019-09-15 MED ORDER — LACTATED RINGERS IV SOLN: 500.0000 mL | INTRAVENOUS | Status: DC | PRN

## 2019-09-15 MED ORDER — MISOPROSTOL 25 MCG QUARTER TABLET
25.0000 ug | ORAL_TABLET | ORAL | Status: DC | PRN
  Administered 2019-09-15 (×3): 25 ug via VAGINAL
  Filled 2019-09-15: qty 1

## 2019-09-15 MED ORDER — OXYTOCIN 40 UNITS IN NORMAL SALINE INFUSION - SIMPLE MED
2.5000 [IU]/h | INTRAVENOUS | Status: DC
  Filled 2019-09-15: qty 1000

## 2019-09-15 MED ORDER — MISOPROSTOL 25 MCG QUARTER TABLET
25.0000 ug | ORAL_TABLET | ORAL | Status: DC
  Administered 2019-09-16: 25 ug via VAGINAL
  Filled 2019-09-15 (×2): qty 1

## 2019-09-15 MED ORDER — LIDOCAINE HCL (PF) 1 % IJ SOLN: 30.0000 mL | INTRAMUSCULAR | Status: DC | PRN

## 2019-09-15 MED ORDER — ONDANSETRON HCL 4 MG/2ML IJ SOLN: 4.0000 mg | Freq: Four times a day (QID) | INTRAMUSCULAR | Status: DC | PRN

## 2019-09-15 MED ORDER — ACETAMINOPHEN 325 MG PO TABS
650.0000 mg | ORAL_TABLET | ORAL | Status: DC | PRN
  Administered 2019-09-15: 650 mg via ORAL
  Filled 2019-09-15: qty 2

## 2019-09-15 NOTE — MAU Note (Signed)
Pt denied any symptoms and tolerated swab well. Sent to desk for admission to L&D.

## 2019-09-15 NOTE — Progress Notes (Signed)
Dr. Charlesetta Garibaldi in department to discuss speculum exam findings with patient. Phone call made from department desk phone to patient's room phone to discuss assessment findings and maintain confidentiality from patient's significant other at the bedside.

## 2019-09-15 NOTE — Progress Notes (Signed)
Subjective:    Sleeping soundly, denies needs.   Objective:    VS: BP (!) 111/42   Pulse 83   Temp 98.6 F (37 C) (Oral)   Resp 17   Ht 5\' 7"  (1.702 m)   Wt 117.9 kg   LMP 12/06/2018 (Exact Date)   BMI 40.72 kg/m  FHR : baseline 155 / variability moderate / accelerations present / absent decelerations Toco: contractions irregular Membranes: intact Dilation: Closed Effacement (%): 50 Station: (-4/-5) Presentation: Vertex Exam by:: Burman Foster, cnm  Assessment/Plan:   38 y.o. [id number] [redacted]w[redacted]d  Labor: Cytotec #3 placed, unable to insert cervical balloon.  Preeclampsia:  N.A Fetal Wellbeing:  Category I Pain Control:  Labor support without medications I/D:  GBS negative  HSV positive     -on suppression, 1-2 cm lesion on left labia minora noted, no drainage noted Anticipated MOD:  NSVD  Ronney Lion, MSN 09/15/2019 10:19 PM
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
## 2019-09-16 LAB — RPR: RPR Ser Ql: NONREACTIVE

## 2019-09-16 MED ORDER — WITCH HAZEL-GLYCERIN EX PADS: 1.0000 "application " | MEDICATED_PAD | CUTANEOUS | Status: DC | PRN

## 2019-09-16 MED ORDER — ACETAMINOPHEN 325 MG PO TABS
650.0000 mg | ORAL_TABLET | ORAL | Status: DC | PRN
  Administered 2019-09-18: 650 mg via ORAL
  Filled 2019-09-16: qty 2

## 2019-09-16 MED ORDER — PHENYLEPHRINE 40 MCG/ML (10ML) SYRINGE FOR IV PUSH (FOR BLOOD PRESSURE SUPPORT): 80.0000 ug | PREFILLED_SYRINGE | INTRAVENOUS | Status: DC | PRN

## 2019-09-16 MED ORDER — DIPHENHYDRAMINE HCL 50 MG/ML IJ SOLN: 12.5000 mg | INTRAMUSCULAR | Status: DC | PRN

## 2019-09-16 MED ORDER — ZOLPIDEM TARTRATE 5 MG PO TABS: 5.0000 mg | ORAL_TABLET | Freq: Every evening | ORAL | Status: DC | PRN

## 2019-09-16 MED ORDER — COCONUT OIL OIL: 1.0000 "application " | TOPICAL_OIL | Status: DC | PRN

## 2019-09-16 MED ORDER — DIBUCAINE (PERIANAL) 1 % EX OINT: 1.0000 "application " | TOPICAL_OINTMENT | CUTANEOUS | Status: DC | PRN

## 2019-09-16 MED ORDER — EPHEDRINE 5 MG/ML INJ: 10.0000 mg | INTRAVENOUS | Status: DC | PRN

## 2019-09-16 MED ORDER — ONDANSETRON HCL 4 MG PO TABS: 4.0000 mg | ORAL_TABLET | ORAL | Status: DC | PRN

## 2019-09-16 MED ORDER — FENTANYL CITRATE (PF) 100 MCG/2ML IJ SOLN
100.0000 ug | INTRAMUSCULAR | Status: DC | PRN
  Administered 2019-09-16 (×2): 100 ug via INTRAVENOUS
  Filled 2019-09-16 (×2): qty 2

## 2019-09-16 MED ORDER — IBUPROFEN 600 MG PO TABS
600.0000 mg | ORAL_TABLET | Freq: Four times a day (QID) | ORAL | Status: DC
  Administered 2019-09-17 – 2019-09-18 (×6): 600 mg via ORAL
  Filled 2019-09-16 (×6): qty 1

## 2019-09-16 MED ORDER — ONDANSETRON HCL 4 MG/2ML IJ SOLN: 4.0000 mg | INTRAMUSCULAR | Status: DC | PRN

## 2019-09-16 MED ORDER — BENZOCAINE-MENTHOL 20-0.5 % EX AERO: 1.0000 "application " | INHALATION_SPRAY | CUTANEOUS | Status: DC | PRN

## 2019-09-16 MED ORDER — DIPHENHYDRAMINE HCL 25 MG PO CAPS: 25.0000 mg | ORAL_CAPSULE | Freq: Four times a day (QID) | ORAL | Status: DC | PRN

## 2019-09-16 MED ORDER — TERBUTALINE SULFATE 1 MG/ML IJ SOLN: 0.2500 mg | Freq: Once | INTRAMUSCULAR | Status: DC | PRN

## 2019-09-16 MED ORDER — FENTANYL-BUPIVACAINE-NACL 0.5-0.125-0.9 MG/250ML-% EP SOLN: 12.0000 mL/h | EPIDURAL | Status: DC | PRN

## 2019-09-16 MED ORDER — TETANUS-DIPHTH-ACELL PERTUSSIS 5-2.5-18.5 LF-MCG/0.5 IM SUSP: 0.5000 mL | Freq: Once | INTRAMUSCULAR | Status: DC

## 2019-09-16 MED ORDER — SIMETHICONE 80 MG PO CHEW
80.0000 mg | CHEWABLE_TABLET | ORAL | Status: DC | PRN
  Administered 2019-09-17: 80 mg via ORAL

## 2019-09-16 MED ORDER — OXYTOCIN 40 UNITS IN NORMAL SALINE INFUSION - SIMPLE MED
1.0000 m[IU]/min | INTRAVENOUS | Status: DC
  Administered 2019-09-16: 2 m[IU]/min via INTRAVENOUS

## 2019-09-16 MED ORDER — SENNOSIDES-DOCUSATE SODIUM 8.6-50 MG PO TABS
2.0000 | ORAL_TABLET | ORAL | Status: DC
  Administered 2019-09-17 (×2): 2 via ORAL
  Filled 2019-09-16 (×2): qty 2

## 2019-09-16 MED ORDER — PRENATAL MULTIVITAMIN CH
1.0000 | ORAL_TABLET | Freq: Every day | ORAL | Status: DC
  Administered 2019-09-17: 1 via ORAL
  Filled 2019-09-16: qty 1

## 2019-09-16 MED ORDER — LACTATED RINGERS IV SOLN: 500.0000 mL | Freq: Once | INTRAVENOUS | Status: DC

## 2019-09-16 NOTE — Plan of Care (Signed)

## 2019-09-16 NOTE — Progress Notes (Signed)
S:  Cramping, coping well.  Desires unmedicated birth, hx delivery with last baby natural. Pelvis proven to 3900 gm.  S/P cervical ripening with cytotec overnight.   O: Vitals:   09/16/19 0811 09/16/19 0930  BP: 106/62 (!) 122/50  Pulse: 99 92  Resp:  16  Temp:      FHR 140, mod var, + accels, no decels Ctx q 2-3, palp mild  SVE FT/thick/high, digitally dilated to 1 cm Cervical balloon placed, inflated 60 cc fluid and traction to leg.  Patient tolerated well.  A/P: [id number] at [redacted]w[redacted]d for IOL, BPP 6/8 in office yesterday. First baby deceased at 60 months d/t SIDS  Hx HSV on prophylaxis ? Lesion on R labia, appears folliculitis but patient was offered C/S nonetheless as cannot r/o herpetic lesion. Patient declined, partner not aware of STI diagnosis  FHT cat 1 GBS neg  Will continue cervical ripening with CB and low dose Pitocin Anticipate NSVB Analgesia/anesthesia PRN  Juliene Pina, MSN, CNM 09/16/2019, 9:42 AM

## 2019-09-16 NOTE — Progress Notes (Signed)
Subjective: Pt breathing with contractions. Has had second dose of Fentanyl.  Husband supportive and in room  Objective: BP (!) 118/59   Pulse 97   Temp 98.4 F (36.9 C) (Oral)   Resp 18   Ht 5\' 7"  (1.702 m)   Wt 117.9 kg   LMP 12/06/2018 (Exact Date)   BMI 40.72 kg/m  No intake/output data recorded. No intake/output data recorded.  FHT: Category 1 FHT 150 accels no decels variability moderate UC:   regular, every 2-3 minutes SVE:   Dilation: 6.5 Effacement (%): 100 Station: -2 Exam by:: Irene Shipper, CNM Pitocin at 5 mu AROM clear fluid  Assessment:  [id number] at 40.4 IUP induction for low BPP First baby died of SIDS at 4 months Cat 1 strip  Plan: Monitor progress Anticipate SVD  Starla Link CNM, MSN 09/16/2019, 9:12 PM

## 2019-09-16 NOTE — Progress Notes (Signed)
Subjective:    Reports cramping and is more uncomfortable.   Objective:    VS: BP 110/71   Pulse 88   Temp 98.2 F (36.8 C) (Oral)   Resp 18   Ht 5\' 7"  (1.702 m)   Wt 117.9 kg   LMP 12/06/2018 (Exact Date)   BMI 40.72 kg/m  FHR : baseline 140 / variability moderate / accelerations present / absent decelerations Toco: contractions every 2-4 minutes / mild-mod per palpation Membranes: Intact Dilation: fingertip Effacement (%): 50 Station: -4 Presentation: Vertex Exam by: Sharon Mckay, CNM Too many contractions for repeat Cytotec, unable to insert cervical balloon.   Assessment/Plan:   38 y.o. [id number] [redacted]w[redacted]d IOL for BPP 6/8 in office  Labor: Does not meet protocol for repeat Cytotec. Discussed Pitocin w/ pt and pt agrees. Will discuss plan w/ on-coming provider.  Preeclampsia:  N/A Fetal Wellbeing:  Category I Pain Control:  May shower for comfort I/D:  GBS negative  HSV    -on suppression, no change in lesion  Anticipated MOD:  NSVD  Sharon Mckay CNM, MSN 09/16/2019 7:02 AM

## 2019-09-17 ENCOUNTER — Encounter (HOSPITAL_COMMUNITY): Payer: Self-pay

## 2019-09-17 LAB — CBC
HCT: 31.1 % — ABNORMAL LOW (ref 36.0–46.0)
Hemoglobin: 10.1 g/dL — ABNORMAL LOW (ref 12.0–15.0)
MCH: 29.4 pg (ref 26.0–34.0)
MCHC: 32.5 g/dL (ref 30.0–36.0)
MCV: 90.7 fL (ref 80.0–100.0)
Platelets: 212 10*3/uL (ref 150–400)
RBC: 3.43 MIL/uL — ABNORMAL LOW (ref 3.87–5.11)
RDW: 14.2 % (ref 11.5–15.5)
WBC: 18.7 10*3/uL — ABNORMAL HIGH (ref 4.0–10.5)
nRBC: 0 % (ref 0.0–0.2)

## 2019-09-17 MED ORDER — RHO D IMMUNE GLOBULIN 1500 UNIT/2ML IJ SOSY
300.0000 ug | PREFILLED_SYRINGE | Freq: Once | INTRAMUSCULAR | Status: AC
  Administered 2019-09-17: 300 ug via INTRAVENOUS
  Filled 2019-09-17: qty 2

## 2019-09-17 MED ORDER — MEASLES, MUMPS & RUBELLA VAC IJ SOLR
0.5000 mL | Freq: Once | INTRAMUSCULAR | Status: AC
  Administered 2019-09-18: 0.5 mL via SUBCUTANEOUS
  Filled 2019-09-17: qty 0.5

## 2019-09-17 NOTE — Progress Notes (Signed)
Subjective: Postpartum Day 1: Vaginal delivery, no laceration Patient up ad lib, reports no syncope or dizziness. Feeding:  Bottle Contraceptive plan:  ocps  Objective: Vital signs in last 24 hours: Temp:  [97.7 F (36.5 C)-98.5 F (36.9 C)] 98.5 F (36.9 C) (11/22 0841) Pulse Rate:  [73-120] 73 (11/22 0841) Resp:  [16-19] 18 (11/22 0841) BP: (85-130)/(46-94) 90/52 (11/22 0841) SpO2:  [98 %-99 %] 98 % (11/22 0518)  Physical Exam:  General: alert, cooperative and no distress Lochia: appropriate Uterine Fundus: firm Perineum: healing well DVT Evaluation: No evidence of DVT seen on physical exam.   CBC Latest Ref Rng & Units 09/17/2019 09/15/2019 03/01/2012  WBC 4.0 - 10.5 K/uL 18.7(H) 7.3 9.3  Hemoglobin 12.0 - 15.0 g/dL 10.1(L) 10.7(L) 9.5(L)  Hematocrit 36.0 - 46.0 % 31.1(L) 33.3(L) 29.7(L)  Platelets 150 - 400 K/uL 212 195 163     Assessment/Plan: Status post vaginal delivery day 1. Stable Continue current care. Plan for discharge tomorrow    Pleas Koch ProtheroCNM 09/17/2019, 11:14 AM
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
## 2019-09-18 LAB — RH IG WORKUP (INCLUDES ABO/RH)
ABO/RH(D): A NEG
Fetal Screen: NEGATIVE
Gestational Age(Wks): 40.4
Unit division: 0

## 2019-09-18 MED ORDER — COCONUT OIL OIL: 1.0000 "application " | TOPICAL_OIL | 0 refills | Status: DC | PRN

## 2019-09-18 MED ORDER — IBUPROFEN 600 MG PO TABS: 600.0000 mg | ORAL_TABLET | Freq: Four times a day (QID) | ORAL | 0 refills | Status: DC

## 2019-09-18 MED ORDER — BENZOCAINE-MENTHOL 20-0.5 % EX AERO: 1.0000 "application " | INHALATION_SPRAY | CUTANEOUS | Status: DC | PRN

## 2019-09-18 MED ORDER — ACETAMINOPHEN 500 MG PO TABS: 1000.0000 mg | ORAL_TABLET | Freq: Four times a day (QID) | ORAL | 2 refills | Status: DC | PRN

## 2019-09-18 NOTE — Discharge Summary (Signed)
Obstetric Discharge Summary Reason for Admission: induction of labor and BPP 6/8 Prenatal Procedures: NST and ultrasound Intrapartum Procedures: spontaneous vaginal delivery and cytotec, cervical balloon, Pitocin Postpartum Procedures: Rho(D) Ig and Rubella Ig Complications-Operative and Postpartum: none Hemoglobin  Date Value Ref Range Status  09/17/2019 10.1 (L) 12.0 - 15.0 g/dL Final   HCT  Date Value Ref Range Status  09/17/2019 31.1 (L) 36.0 - 46.0 % Final    Physical Exam:  General: alert, cooperative and no distress Lochia: appropriate Uterine Fundus: firm Incision: healing well DVT Evaluation: No cords or calf tenderness. Calf/Ankle edema is present.  Discharge Diagnoses: Principal Problem:   Postpartum care following vaginal delivery 11/21 Active Problems:   Encounter for induction of labor   SVD (spontaneous vaginal delivery)    Discharge Information: Date: 09/18/2019 Activity: pelvic rest Diet: routine Medications:  Allergies as of 09/18/2019      Reactions   Amoxicillin Shortness Of Breath   Penicillins Shortness Of Breath   Did it involve swelling of the face/tongue/throat, SOB, or low BP? Yes Did it involve sudden or severe rash/hives, skin peeling, or any reaction on the inside of your mouth or nose? Yes Did you need to seek medical attention at a hospital or doctor's office? Yes When did it last happen?6 yrsago If all above answers are "NO", may proceed with cephalosporin use.   Sulfur Hives, Shortness Of Breath   Raspberry       Medication List    STOP taking these medications   Norgestimate-Ethinyl Estradiol Triphasic 0.18/0.215/0.25 MG-35 MCG tablet Commonly known as: Ortho Tri-Cyclen (28)   valACYclovir 1000 MG tablet Commonly known as: VALTREX     TAKE these medications   acetaminophen 500 MG tablet Commonly known as: TYLENOL Take 2 tablets (1,000 mg total) by mouth every 6 (six) hours as needed.   benzocaine-Menthol 20-0.5 %  Aero Commonly known as: DERMOPLAST Apply 1 application topically as needed for irritation (perineal discomfort).   coconut oil Oil Apply 1 application topically as needed.   docusate sodium 100 MG capsule Commonly known as: COLACE Take 100 mg by mouth daily as needed for constipation.   ferrous sulfate 325 (65 FE) MG tablet Take 325 mg by mouth daily with breakfast.   ibuprofen 600 MG tablet Commonly known as: ADVIL Take 1 tablet (600 mg total) by mouth every 6 (six) hours.   prenatal multivitamin Tabs tablet Take 1 tablet by mouth daily.   Proctofoam HC rectal foam Generic drug: hydrocortisone-pramoxine Place 1 application rectally 3 (three) times daily.   TUMS PO Take 1-2 tablets by mouth daily as needed (heartburn).            Discharge Care Instructions  (From admission, onward)         Start     Ordered   09/18/19 0000  Discharge wound care:    Comments: Sitz baths 2 times /day with warm water x 1 week. May add herbals: 1 ounce dried comfrey leaf* 1 ounce calendula flowers 1 ounce lavender flowers 1/2 ounce dried uva ursi leaves 1/2 ounce witch hazel blossoms (if you can find them) 1/2 ounce dried sage leaf 1/2 cup sea salt Directions: Bring 2 quarts of water to a boil. Turn off heat, and place 1 ounce (approximately 1 large handful) of the above mixed herbs (not the salt) into the pot. Steep, covered, for 30 minutes.  Strain the liquid well with a fine mesh strainer, and discard the herb material. Add 2 quarts of liquid  to the tub, along with the 1/2 cup of salt. This medicinal liquid can also be made into compresses and peri-rinses.   09/18/19 0427         Condition: stable Instructions: per hospital booklet Discharge to: home [facility] .   Specialty: Obstetrics and Gynecology Contact information: [address] [place] [id number] [phone]        [facility], [place]. Schedule an appointment as soon as possible for a visit in 6 week(s).   Specialty: Obstetrics and Gynecology Contact information: [address] [place] [address] [phone]           Newborn Data: Live born female  Birth Weight: 8 lb 5 oz (3771 g) APGAR: 9, 9  Newborn Delivery   Birth date/time: 09/16/2019 21:54:00 Delivery type: Vaginal, Spontaneous      Home with mother.  Juliene Pina, CNM 09/18/2019, 4:28 AM

## 2019-09-19 LAB — TYPE AND SCREEN
ABO/RH(D): A NEG
Antibody Screen: POSITIVE
Unit division: 0
Unit division: 0

## 2019-09-19 LAB — BPAM RBC
Blood Product Expiration Date: 202012182359
Blood Product Expiration Date: 202012192359
Unit Type and Rh: 600
Unit Type and Rh: 600

## 2019-09-22 ENCOUNTER — Encounter (HOSPITAL_COMMUNITY): Payer: Self-pay

## 2019-09-22 ENCOUNTER — Inpatient Hospital Stay (HOSPITAL_COMMUNITY)
Admission: AD | Admit: 2019-09-22 | Discharge: 2019-09-26 | DRG: 776 | Disposition: A | Discharge status: Home / Self Care | Payer: Medicaid Other | Attending: Obstetrics & Gynecology | Admitting: Obstetrics & Gynecology

## 2019-09-22 ENCOUNTER — Other Ambulatory Visit: Payer: Self-pay

## 2019-09-22 DIAGNOSIS — O9089 Other complications of the puerperium, not elsewhere classified: Secondary | ICD-10-CM | POA: Diagnosis present

## 2019-09-22 DIAGNOSIS — O99345 Other mental disorders complicating the puerperium: Secondary | ICD-10-CM | POA: Diagnosis present

## 2019-09-22 DIAGNOSIS — R1011 Right upper quadrant pain: Secondary | ICD-10-CM

## 2019-09-22 DIAGNOSIS — R001 Bradycardia, unspecified: Secondary | ICD-10-CM | POA: Diagnosis present

## 2019-09-22 DIAGNOSIS — O1415 Severe pre-eclampsia, complicating the puerperium: Secondary | ICD-10-CM | POA: Diagnosis not present

## 2019-09-22 DIAGNOSIS — R0602 Shortness of breath: Secondary | ICD-10-CM | POA: Diagnosis not present

## 2019-09-22 DIAGNOSIS — K30 Functional dyspepsia: Secondary | ICD-10-CM

## 2019-09-22 DIAGNOSIS — F4310 Post-traumatic stress disorder, unspecified: Secondary | ICD-10-CM | POA: Diagnosis present

## 2019-09-22 DIAGNOSIS — Z20828 Contact with and (suspected) exposure to other viral communicable diseases: Secondary | ICD-10-CM | POA: Diagnosis present

## 2019-09-22 DIAGNOSIS — R0789 Other chest pain: Secondary | ICD-10-CM | POA: Diagnosis present

## 2019-09-22 DIAGNOSIS — O872 Hemorrhoids in the puerperium: Secondary | ICD-10-CM | POA: Diagnosis present

## 2019-09-22 DIAGNOSIS — O1425 HELLP syndrome, complicating the puerperium: Principal | ICD-10-CM | POA: Diagnosis present

## 2019-09-22 DIAGNOSIS — R600 Localized edema: Secondary | ICD-10-CM

## 2019-09-22 DIAGNOSIS — Z880 Allergy status to penicillin: Secondary | ICD-10-CM

## 2019-09-22 DIAGNOSIS — R079 Chest pain, unspecified: Secondary | ICD-10-CM | POA: Diagnosis not present

## 2019-09-22 LAB — URINALYSIS, ROUTINE W REFLEX MICROSCOPIC
Bilirubin Urine: NEGATIVE
Glucose, UA: NEGATIVE mg/dL
Ketones, ur: NEGATIVE mg/dL
Nitrite: NEGATIVE
Protein, ur: NEGATIVE mg/dL
Specific Gravity, Urine: 1.003 — ABNORMAL LOW (ref 1.005–1.030)
pH: 6 (ref 5.0–8.0)

## 2019-09-22 LAB — CBC
HCT: 32.2 % — ABNORMAL LOW (ref 36.0–46.0)
Hemoglobin: 10.6 g/dL — ABNORMAL LOW (ref 12.0–15.0)
MCH: 29.4 pg (ref 26.0–34.0)
MCHC: 32.9 g/dL (ref 30.0–36.0)
MCV: 89.4 fL (ref 80.0–100.0)
Platelets: 272 10*3/uL (ref 150–400)
RBC: 3.6 MIL/uL — ABNORMAL LOW (ref 3.87–5.11)
RDW: 13.2 % (ref 11.5–15.5)
WBC: 9.5 10*3/uL (ref 4.0–10.5)
nRBC: 0 % (ref 0.0–0.2)

## 2019-09-22 LAB — PROTEIN / CREATININE RATIO, URINE
Creatinine, Urine: 15.03 mg/dL
Total Protein, Urine: <6 mg/dL

## 2019-09-22 LAB — COMPREHENSIVE METABOLIC PANEL
ALT: 44 U/L (ref 0–44)
AST: 33 U/L (ref 15–41)
Albumin: 2.7 g/dL — ABNORMAL LOW (ref 3.5–5.0)
Alkaline Phosphatase: 153 U/L — ABNORMAL HIGH (ref 38–126)
Anion gap: 9 (ref 5–15)
BUN: 11 mg/dL (ref 6–20)
CO2: 25 mmol/L (ref 22–32)
Calcium: 9.3 mg/dL (ref 8.9–10.3)
Chloride: 107 mmol/L (ref 98–111)
Creatinine, Ser: 0.62 mg/dL (ref 0.44–1.00)
GFR calc Af Amer: >60 mL/min (ref >60)
GFR calc non Af Amer: >60 mL/min (ref >60)
Glucose, Bld: 88 mg/dL (ref 70–99)
Potassium: 4.5 mmol/L (ref 3.5–5.1)
Sodium: 141 mmol/L (ref 135–145)
Total Bilirubin: 0.5 mg/dL (ref 0.3–1.2)
Total Protein: 5.9 g/dL — ABNORMAL LOW (ref 6.5–8.1)

## 2019-09-22 MED ORDER — HYDRALAZINE HCL 10 MG PO TABS
10.0000 mg | ORAL_TABLET | Freq: Four times a day (QID) | ORAL | Status: DC
  Administered 2019-09-22 – 2019-09-23 (×2): 10 mg via ORAL
  Filled 2019-09-22 (×2): qty 1

## 2019-09-22 MED ORDER — LABETALOL HCL 5 MG/ML IV SOLN: 20.0000 mg | INTRAVENOUS | Status: DC | PRN

## 2019-09-22 MED ORDER — LABETALOL HCL 5 MG/ML IV SOLN: 80.0000 mg | INTRAVENOUS | Status: DC | PRN

## 2019-09-22 MED ORDER — ALUM & MAG HYDROXIDE-SIMETH 200-200-20 MG/5ML PO SUSP
30.0000 mL | Freq: Once | ORAL | Status: AC
  Administered 2019-09-22: 30 mL via ORAL
  Filled 2019-09-22: qty 30

## 2019-09-22 MED ORDER — LABETALOL HCL 5 MG/ML IV SOLN: 40.0000 mg | INTRAVENOUS | Status: DC | PRN
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
## 2019-09-22 NOTE — Discharge Instructions (Signed)
  Indigestion Indigestion is a feeling of pain, discomfort, burning, or fullness in the upper part of your abdomen. It can come and go. It may occur frequently or rarely. Indigestion tends to occur while you are eating or right after you have finished eating. It may be worse at night and while bending over or lying down. Indigestion may be a symptom of an underlying digestive condition. Follow these instructions at home: Eating and drinking   Follow an eating plan as recommended by your health care provider.  Avoid certain foods and drinks as told by your health care provider. This may include: ? Chocolate and cocoa. ? Peppermint and mint flavorings. ? Garlic and onions. ? Horseradish. ? Spicy and acidic foods, including peppers, chili powder, curry powder, vinegar, hot sauces, and barbecue sauce. ? Citrus fruits, such as oranges, lemons, and limes. ? Tomato-based foods, such as red sauce, chili, salsa, and pizza with red sauce. ? Fried and fatty foods, such as donuts, french fries, potato chips, and high-fat dressings. ? High-fat meats, such as hot dogs and fatty cuts of red and white meats, such as rib eye steak, sausage, ham, and bacon. ? High-fat dairy items, such as whole milk, butter, and cream cheese. ? Coffee and tea (with or without caffeine). ? Drinks that contain alcohol. ? Energy drinks and sports drinks. ? Carbonated drinks or sodas. ? Citrus fruit juices.  Eat small, frequent meals instead of large meals.  Avoid drinking large amounts of liquid with your meals.  Avoid eating meals during the 2-3 hours before bedtime.  Avoid lying down right after you eat.  Avoid exercise for 2 hours after you eat. Lifestyle      Maintain a healthy weight. Ask your health care provider what weight is healthy for you. If you need to lose weight, work with your health care provider to do so safely.  Exercise for at least 30 minutes on 5 or more days each week, or as told by  your health care provider. Avoid exercises that include bending forward. This can make your symptoms worse.  Wear loose-fitting clothing. Do not wear anything tight around your waist that causes pressure on your abdomen.  Do not use any products that contain nicotine or tobacco, such as cigarettes, e-cigarettes, and chewing tobacco. These can make symptoms worse. If you need help quitting, ask your health care provider.  Raise (elevate) the head of your bed about 6 inches (15 cm) when you sleep.  Try to reduce your stress, such as with yoga or meditation. If you need help reducing stress, ask your health care provider. General instructions  Take over-the-counter and prescription medicines only as told by your health care provider. Do not take aspirin, ibuprofen, or other NSAIDs unless your health care provider told you to do so.  Pay attention to any changes in your symptoms.  Keep all follow-up visits as told by your health care provider. This is important. Contact a health care provider if:  You have new symptoms.  You have unexplained weight loss.  You have difficulty swallowing, or it hurts to swallow.  Your symptoms do not improve with treatment.  Your symptoms last for more than 2 days.  You have a fever.  You vomit. Get help right away if:  You have pain in your arms, neck, jaw, teeth, or back.  You feel sweaty, dizzy, or light-headed.  You faint.  You have chest pain or shortness of breath.  You cannot stop vomiting, or   you vomit blood.  Your stool is bloody or black.  You have severe pain in your abdomen. These symptoms may represent a serious problem that is an emergency. Do not wait to see if the symptoms will go away. Get medical help right away. Call your local emergency services (911 in the [place] Do not drive yourself to the hospital. Summary  Indigestion is a feeling of pain, discomfort, burning, or fullness in the upper part of your abdomen. It tends  to occur while you are eating or right after you have finished eating.  Follow an eating plan and other lifestyle changes as told by your health care provider.  Take over-the-counter and prescription medicines only as told by your health care provider. Do not take aspirin, ibuprofen, or other NSAIDs unless your health care provider told you to do so.  Contact your health care provider if your symptoms do not get better or they get worse. Some symptoms may represent a serious problem that is an emergency. Do not wait to see if the symptoms will go away. Get medical help right away. This information is not intended to replace advice given to you by your health care provider. Make sure you discuss any questions you have with your health care provider. Document Released: 11/19/2004 Document Revised: 03/14/2018 Document Reviewed: 03/14/2018 Elsevier Patient Education  2020 [employer]  
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
## 2019-09-22 NOTE — MAU Note (Signed)
Patient reports to MAU complaining of swelling in her leg and rib pain/ "tightness" since yesterday. Patient states she was cooking all day yesterday rested some today and the symptoms continues. She was concerned and her provider advised a visit MAU. No headache or blurry vision.
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
## 2019-09-23 ENCOUNTER — Observation Stay (HOSPITAL_BASED_OUTPATIENT_CLINIC_OR_DEPARTMENT_OTHER): Payer: Medicaid Other

## 2019-09-23 ENCOUNTER — Inpatient Hospital Stay (HOSPITAL_COMMUNITY): Payer: Medicaid Other

## 2019-09-23 DIAGNOSIS — R0789 Other chest pain: Secondary | ICD-10-CM

## 2019-09-23 DIAGNOSIS — O1415 Severe pre-eclampsia, complicating the puerperium: Secondary | ICD-10-CM | POA: Diagnosis not present

## 2019-09-23 DIAGNOSIS — R079 Chest pain, unspecified: Secondary | ICD-10-CM

## 2019-09-23 DIAGNOSIS — R001 Bradycardia, unspecified: Secondary | ICD-10-CM | POA: Diagnosis present

## 2019-09-23 DIAGNOSIS — K30 Functional dyspepsia: Secondary | ICD-10-CM | POA: Diagnosis not present

## 2019-09-23 DIAGNOSIS — R1011 Right upper quadrant pain: Secondary | ICD-10-CM | POA: Diagnosis not present

## 2019-09-23 DIAGNOSIS — O99345 Other mental disorders complicating the puerperium: Secondary | ICD-10-CM | POA: Diagnosis present

## 2019-09-23 DIAGNOSIS — R600 Localized edema: Secondary | ICD-10-CM | POA: Diagnosis not present

## 2019-09-23 DIAGNOSIS — O872 Hemorrhoids in the puerperium: Secondary | ICD-10-CM | POA: Diagnosis present

## 2019-09-23 DIAGNOSIS — O1425 HELLP syndrome, complicating the puerperium: Secondary | ICD-10-CM | POA: Diagnosis present

## 2019-09-23 DIAGNOSIS — Z20828 Contact with and (suspected) exposure to other viral communicable diseases: Secondary | ICD-10-CM | POA: Diagnosis present

## 2019-09-23 DIAGNOSIS — F4310 Post-traumatic stress disorder, unspecified: Secondary | ICD-10-CM | POA: Diagnosis present

## 2019-09-23 DIAGNOSIS — O9089 Other complications of the puerperium, not elsewhere classified: Secondary | ICD-10-CM | POA: Diagnosis present

## 2019-09-23 DIAGNOSIS — Z880 Allergy status to penicillin: Secondary | ICD-10-CM | POA: Diagnosis not present

## 2019-09-23 LAB — CBC
HCT: 32.1 % — ABNORMAL LOW (ref 36.0–46.0)
Hemoglobin: 10.7 g/dL — ABNORMAL LOW (ref 12.0–15.0)
MCH: 29.6 pg (ref 26.0–34.0)
MCHC: 33.3 g/dL (ref 30.0–36.0)
MCV: 88.7 fL (ref 80.0–100.0)
Platelets: 282 10*3/uL (ref 150–400)
RBC: 3.62 MIL/uL — ABNORMAL LOW (ref 3.87–5.11)
RDW: 13.2 % (ref 11.5–15.5)
WBC: 7.9 10*3/uL (ref 4.0–10.5)
nRBC: 0 % (ref 0.0–0.2)

## 2019-09-23 LAB — COMPREHENSIVE METABOLIC PANEL
ALT: 108 U/L — ABNORMAL HIGH (ref 0–44)
AST: 81 U/L — ABNORMAL HIGH (ref 15–41)
Albumin: 2.8 g/dL — ABNORMAL LOW (ref 3.5–5.0)
Alkaline Phosphatase: 146 U/L — ABNORMAL HIGH (ref 38–126)
Anion gap: 10 (ref 5–15)
BUN: 11 mg/dL (ref 6–20)
CO2: 25 mmol/L (ref 22–32)
Calcium: 8.6 mg/dL — ABNORMAL LOW (ref 8.9–10.3)
Chloride: 106 mmol/L (ref 98–111)
Creatinine, Ser: 0.66 mg/dL (ref 0.44–1.00)
GFR calc Af Amer: >60 mL/min (ref >60)
GFR calc non Af Amer: >60 mL/min (ref >60)
Glucose, Bld: 94 mg/dL (ref 70–99)
Potassium: 4 mmol/L (ref 3.5–5.1)
Sodium: 141 mmol/L (ref 135–145)
Total Bilirubin: 0.7 mg/dL (ref 0.3–1.2)
Total Protein: 6.1 g/dL — ABNORMAL LOW (ref 6.5–8.1)

## 2019-09-23 LAB — ECHOCARDIOGRAM COMPLETE

## 2019-09-23 LAB — AMYLASE: Amylase: 32 U/L (ref 28–100)

## 2019-09-23 LAB — TSH: TSH: 1.254 u[IU]/mL (ref 0.350–4.500)

## 2019-09-23 LAB — PROTEIN / CREATININE RATIO, URINE
Creatinine, Urine: 72.78 mg/dL
Total Protein, Urine: <6 mg/dL

## 2019-09-23 LAB — BRAIN NATRIURETIC PEPTIDE: B Natriuretic Peptide: 220.3 pg/mL — ABNORMAL HIGH (ref 0.0–100.0)

## 2019-09-23 LAB — TROPONIN I (HIGH SENSITIVITY)
Troponin I (High Sensitivity): 5 ng/L (ref <18)
Troponin I (High Sensitivity): 4 ng/L (ref <18)

## 2019-09-23 LAB — LIPASE, BLOOD: Lipase: 25 U/L (ref 11–51)

## 2019-09-23 MED ORDER — LABETALOL HCL 5 MG/ML IV SOLN: 20.0000 mg | INTRAVENOUS | Status: DC | PRN

## 2019-09-23 MED ORDER — LACTATED RINGERS IV SOLN
INTRAVENOUS | Status: DC
  Administered 2019-09-23 – 2019-09-25 (×3): via INTRAVENOUS

## 2019-09-23 MED ORDER — HYDRALAZINE HCL 20 MG/ML IJ SOLN: 5.0000 mg | INTRAMUSCULAR | Status: DC | PRN

## 2019-09-23 MED ORDER — HYDRALAZINE HCL 20 MG/ML IJ SOLN: 10.0000 mg | INTRAMUSCULAR | Status: DC | PRN

## 2019-09-23 MED ORDER — LABETALOL HCL 5 MG/ML IV SOLN: 40.0000 mg | INTRAVENOUS | Status: DC | PRN

## 2019-09-23 MED ORDER — MAGNESIUM SULFATE 40 GM/1000ML IV SOLN
2.0000 g/h | INTRAVENOUS | Status: AC
  Administered 2019-09-23 – 2019-09-24 (×2): 2 g/h via INTRAVENOUS
  Filled 2019-09-23 (×3): qty 1000

## 2019-09-23 MED ORDER — ALUM & MAG HYDROXIDE-SIMETH 200-200-20 MG/5ML PO SUSP: 30.0000 mL | Freq: Four times a day (QID) | ORAL | Status: DC | PRN

## 2019-09-23 MED ORDER — MAGNESIUM SULFATE BOLUS VIA INFUSION
4.0000 g | Freq: Once | INTRAVENOUS | Status: AC
  Administered 2019-09-23: 4 g via INTRAVENOUS
  Filled 2019-09-23: qty 1000

## 2019-09-23 MED ORDER — ACETAMINOPHEN 325 MG PO TABS
650.0000 mg | ORAL_TABLET | Freq: Four times a day (QID) | ORAL | Status: DC | PRN
  Administered 2019-09-23 – 2019-09-26 (×4): 650 mg via ORAL
  Filled 2019-09-23 (×4): qty 2

## 2019-09-23 NOTE — Progress Notes (Addendum)
Hospital day # 1 pregnancy at Unknown--Per H&P: Sharon Mckay is an 38 y.o. female P4 PPD # 6 after a vaginal delivery who presented with complaints of chest pressure and edema in lower extremities.  Chest pressure in sternum and epigastric region radiates to opposite side in the back.  She got mylanta in the MAU and the pressure decreased significantly, but is still present, now is more of a discomfort. Chest pressure would worsen with lying horizontal and improve with sitting or standing up.  Edema was pronounced more yesterday but has improved today with leg elevation.  She has had normal postpartum bleeding.  She denies headaches, changes in vision, nausea, vomiting.  She had some shortness of breath with certain positions such as lying down, bending over that would improve with sitting or standing up. Her pregnancy course was significant for AMA, BMI of 39.9.  Intrapartum and postpartum course was uncomplicated in the hospital..  Today (09/23/2019): Pt had elevated BP 150s/70s, doubled of LFT and dx with preeclampsia with severe features and started on magnesium.   S:  Pt up to the bathroom and tolerated well, pt denies HA< or vision changes, Endorses her swelling is now gone, but was worse yesterday. Pt endorses feeling a little better, but still having chest heaviness and pain in RUQ. Talked with pt about needing more labs today, and if and when we should start magnesium, pt questions were answered and denies any further questions, pt updated on POC.   O: BP 137/71 (BP Location: Left Arm)   Pulse (!) 45   Temp 98.2 F (36.8 C) (Oral)   Resp 18   SpO2 98%     Physical Exam  Constitutional: She is oriented to person, place, and time. She appears well-developed and well-nourished.  Head: Normocephalic and atraumatic.  Eyes: Conjunctivae and EOM are normal.  Neck: Normal range of motion. Neck supple.  Cardiovascular: Bradycaria rate, regular rhythm, normal heart sounds and intact distal pulses.   Respiratory: Effort normal and breath sounds normal.  GI (Abdomen): Soft. She exhibits no mass. Mild tenderness in right upper quadrant and mid epigastric regions, no rebound, no guarding, neg Murphy's sign.   Genitourinary: Deferred. Musculoskeletal: Normal range of motion. No signs of DVT, neg hormones, no swelling noted to lower extremities.  Neurological: She is alert and oriented to person, place, and time.  Skin: Skin is warm and dry.  Psychiatric: She has a normal mood and affect. Judgment normal.  She reports feeling anxious about her symptoms.          Meds: Started on Magnesium 4gm bolus with 2g/hr to follow @ 1300 on 11/28  A/P: Curt Bears, [id number], PP readmit PPD#7 now, LOS#1 today, was readmitted for chest pain, RUQ pain, bilateral LE edema, feeling no good, dx on 11/28 with preeclampsia with severe features.    Preeclampsia with severe features: Elevated BP, but not severe range, pt was given 10mg  PO hydralazine, undocumented reason why, BP became elevated 150/70 over night, this morning it was 130/76 after 10mg  hydralazine PO. I d/c that order, for BP meds. Avoid labetalol due to bradycardia. Magnesium was started after consulting with Dr Gertie Exon with MFM, Dr Dalbert Garnet with cardiology endorses consent for magnesium use if indicated with bradycardia. LFT were 33/44 and today doubled 81/108, with continued RUQ pain pt was dx with PreE with SF and started on mag per MFM. PCR was undetectable on day one of admission and repeat was still undedectable. Monitor BP, neruo s/sx, strict IO,  hydralazine protocol if indicated with BP >160/110, magnesium for 24 hours. Pending MFM note.   Bradycardia: HR remains in the 40s, no syncope, denies SOB. Holter monitor in place and to be read by cardiology, cardiology was consulted and performed echo this am which was unremarkable. Tropin's so far unremarkable. Cardiology recommended repeat troponin's. BNP elevated at 220.3 on 11/28. Plan to keep  telemetry and monitor.   RUQ Pain: neg murphy's sign, but tenderness under RUQ and epigastric region, little to no relief with GI cocktail. Needs to rule out cholecystitis. Unremarkable lipase and amylase. Pending RUQ [place]   [facility] CNM, MSN 09/23/2019 10:26 AM   Patient is seen and examined. She reports continued RUQ pain and some back pain. She denies SOB reports that her edema in feet has improved. Working diagnosis preeclampsia with severe features based on bp and elevated liver enzymes. Continue magnesium for 24 hours of therapy. RUQ ultrasound pending to r/o gallbladder disease. MFM consult pending. Dr. Sallye Ober to assume care at 7am tomorrow

## 2019-09-23 NOTE — Progress Notes (Addendum)
   Progress Note  Patient Name: Sharon Mckay Date of Encounter: 09/23/2019  Primary Cardiologist: New  Subjective   Mild epigastric and rib pain; worse with lying flat  Inpatient Medications    Scheduled Meds: . hydrALAZINE  10 mg Oral Q6H   Continuous Infusions:  PRN Meds: alum & mag hydroxide-simeth, labetalol **AND** labetalol **AND** labetalol **AND** hydrALAZINE **AND** Measure blood pressure   Vital Signs    Vitals:   09/23/19 0805 09/23/19 0810 09/23/19 0815 09/23/19 0820  BP:    137/71  Pulse:    (!) 45  Resp:      Temp:    98.2 F (36.8 C)  TempSrc:    Oral  SpO2: 99% 98% 99% 98%   No intake or output data in the 24 hours ending 09/23/19 0934 Last 3 Weights 09/15/2019 04/21/2012 02/29/2012  Weight (lbs) 260 lb 227 lb 245 lb  Weight (kg) 117.935 kg 102.967 kg 111.131 kg      Telemetry    Sinus to sinus bradycardia- Personally Reviewed  ECG    Sinus bradycardia; no ST changes - Personally Reviewed  Physical Exam   GEN: No acute distress.   Neck: No JVD Cardiac: RRR, no murmurs, rubs, or gallops.  Respiratory: Clear to auscultation bilaterally. GI: Soft, tender RUQ MS: No edema Neuro:  Nonfocal  Psych: Normal affect   Labs      Chemistry Recent Labs  Lab 09/22/19 1923  NA 141  K 4.5  CL 107  CO2 25  GLUCOSE 88  BUN 11  CREATININE 0.62  CALCIUM 9.3  PROT 5.9*  ALBUMIN 2.7*  AST 33  ALT 44  ALKPHOS 153*  BILITOT 0.5  GFRNONAA >60  GFRAA >60  ANIONGAP 9     Hematology Recent Labs  Lab 09/17/19 0446 09/22/19 1923  WBC 18.7* 9.5  RBC 3.43* 3.60*  HGB 10.1* 10.6*  HCT 31.1* 32.2*  MCV 90.7 89.4  MCH 29.4 29.4  MCHC 32.5 32.9  RDW 14.2 13.2  PLT 212 272    BNP Recent Labs  Lab 09/22/19 1923  BNP 220.3*      Patient Profile     38 y.o. female status post recent delivery with epigastric/rib pain.  Also with bradycardia with heart rate of 42.  Assessment & Plan    1 epigastric pain/rib pain-etiology unclear.   Electrocardiogram shows marked sinus bradycardia but no clear ST changes.  I have reviewed echocardiogram and LV function/RV function appears to be normal.  History does not sound ischemic in etiology but will check enzymes.  She has some pain in right upper quadrant with palpation.  Would check right upper quadrant ultrasound to exclude colecystitis.   2 bradycardia-patient has sinus bradycardia with heart rate in the 40s at times but asymptomatic.  No indication for further intervention.  For questions or updates, please contact [place] Please consult [email or web address] for contact info under        Signed, Kirk Ruths, MD  09/23/2019, 9:34 AM

## 2019-09-23 NOTE — Progress Notes (Signed)
  Echocardiogram 2D Echocardiogram has been performed.  Sharon Mckay M 09/23/2019, 8:08 AM

## 2019-09-24 LAB — CBC
HCT: 33.2 % — ABNORMAL LOW (ref 36.0–46.0)
HCT: 35.4 % — ABNORMAL LOW (ref 36.0–46.0)
HCT: 36.9 % (ref 36.0–46.0)
Hemoglobin: 10.8 g/dL — ABNORMAL LOW (ref 12.0–15.0)
Hemoglobin: 11.8 g/dL — ABNORMAL LOW (ref 12.0–15.0)
Hemoglobin: 12.3 g/dL (ref 12.0–15.0)
MCH: 29 pg (ref 26.0–34.0)
MCH: 29.4 pg (ref 26.0–34.0)
MCH: 29.5 pg (ref 26.0–34.0)
MCHC: 32.5 g/dL (ref 30.0–36.0)
MCHC: 33.3 g/dL (ref 30.0–36.0)
MCHC: 33.3 g/dL (ref 30.0–36.0)
MCV: 88.3 fL (ref 80.0–100.0)
MCV: 88.5 fL (ref 80.0–100.0)
MCV: 89.2 fL (ref 80.0–100.0)
Platelets: 303 10*3/uL (ref 150–400)
Platelets: 325 10*3/uL (ref 150–400)
Platelets: 345 10*3/uL (ref 150–400)
RBC: 3.72 MIL/uL — ABNORMAL LOW (ref 3.87–5.11)
RBC: 4 MIL/uL (ref 3.87–5.11)
RBC: 4.18 MIL/uL (ref 3.87–5.11)
RDW: 13.1 % (ref 11.5–15.5)
RDW: 13.2 % (ref 11.5–15.5)
RDW: 13.2 % (ref 11.5–15.5)
WBC: 7.2 10*3/uL (ref 4.0–10.5)
WBC: 7.6 10*3/uL (ref 4.0–10.5)
WBC: 7.8 10*3/uL (ref 4.0–10.5)
nRBC: 0 % (ref 0.0–0.2)
nRBC: 0 % (ref 0.0–0.2)
nRBC: 0 % (ref 0.0–0.2)

## 2019-09-24 LAB — COMPREHENSIVE METABOLIC PANEL
ALT: 122 U/L — ABNORMAL HIGH (ref 0–44)
ALT: 112 U/L — ABNORMAL HIGH (ref 0–44)
ALT: 99 U/L — ABNORMAL HIGH (ref 0–44)
AST: 68 U/L — ABNORMAL HIGH (ref 15–41)
AST: 53 U/L — ABNORMAL HIGH (ref 15–41)
AST: 37 U/L (ref 15–41)
Albumin: 2.7 g/dL — ABNORMAL LOW (ref 3.5–5.0)
Albumin: 2.9 g/dL — ABNORMAL LOW (ref 3.5–5.0)
Albumin: 2.9 g/dL — ABNORMAL LOW (ref 3.5–5.0)
Alkaline Phosphatase: 155 U/L — ABNORMAL HIGH (ref 38–126)
Alkaline Phosphatase: 153 U/L — ABNORMAL HIGH (ref 38–126)
Alkaline Phosphatase: 165 U/L — ABNORMAL HIGH (ref 38–126)
Anion gap: 12 (ref 5–15)
Anion gap: 9 (ref 5–15)
Anion gap: 11 (ref 5–15)
BUN: 9 mg/dL (ref 6–20)
BUN: 7 mg/dL (ref 6–20)
BUN: 8 mg/dL (ref 6–20)
CO2: 24 mmol/L (ref 22–32)
CO2: 26 mmol/L (ref 22–32)
CO2: 25 mmol/L (ref 22–32)
Calcium: 7.5 mg/dL — ABNORMAL LOW (ref 8.9–10.3)
Calcium: 7.1 mg/dL — ABNORMAL LOW (ref 8.9–10.3)
Calcium: 7 mg/dL — ABNORMAL LOW (ref 8.9–10.3)
Chloride: 104 mmol/L (ref 98–111)
Chloride: 104 mmol/L (ref 98–111)
Chloride: 103 mmol/L (ref 98–111)
Creatinine, Ser: 0.85 mg/dL (ref 0.44–1.00)
Creatinine, Ser: 0.74 mg/dL (ref 0.44–1.00)
Creatinine, Ser: 0.74 mg/dL (ref 0.44–1.00)
GFR calc Af Amer: >60 mL/min (ref >60)
GFR calc Af Amer: >60 mL/min (ref >60)
GFR calc Af Amer: >60 mL/min (ref >60)
GFR calc non Af Amer: >60 mL/min (ref >60)
GFR calc non Af Amer: >60 mL/min (ref >60)
GFR calc non Af Amer: >60 mL/min (ref >60)
Glucose, Bld: 101 mg/dL — ABNORMAL HIGH (ref 70–99)
Glucose, Bld: 124 mg/dL — ABNORMAL HIGH (ref 70–99)
Glucose, Bld: 114 mg/dL — ABNORMAL HIGH (ref 70–99)
Potassium: 4 mmol/L (ref 3.5–5.1)
Potassium: 4.1 mmol/L (ref 3.5–5.1)
Potassium: 3.9 mmol/L (ref 3.5–5.1)
Sodium: 140 mmol/L (ref 135–145)
Sodium: 139 mmol/L (ref 135–145)
Sodium: 139 mmol/L (ref 135–145)
Total Bilirubin: 0.2 mg/dL — ABNORMAL LOW (ref 0.3–1.2)
Total Bilirubin: 0.4 mg/dL (ref 0.3–1.2)
Total Bilirubin: 0.6 mg/dL (ref 0.3–1.2)
Total Protein: 6.1 g/dL — ABNORMAL LOW (ref 6.5–8.1)
Total Protein: 6.7 g/dL (ref 6.5–8.1)
Total Protein: 6.6 g/dL (ref 6.5–8.1)

## 2019-09-24 LAB — URIC ACID: Uric Acid, Serum: 6.6 mg/dL (ref 2.5–7.1)

## 2019-09-24 LAB — MAGNESIUM
Magnesium: 5.7 mg/dL — ABNORMAL HIGH (ref 1.7–2.4)
Magnesium: 5.7 mg/dL — ABNORMAL HIGH (ref 1.7–2.4)

## 2019-09-24 LAB — GLUCOSE, CAPILLARY: Glucose-Capillary: 113 mg/dL — ABNORMAL HIGH (ref 70–99)

## 2019-09-24 LAB — LACTATE DEHYDROGENASE: LDH: 219 U/L — ABNORMAL HIGH (ref 98–192)

## 2019-09-24 LAB — APTT: aPTT: 26 seconds (ref 24–36)

## 2019-09-24 LAB — FIBRINOGEN: Fibrinogen: 347 mg/dL (ref 210–475)

## 2019-09-24 LAB — PROTIME-INR
INR: 1 (ref 0.8–1.2)
Prothrombin Time: 13.5 seconds (ref 11.4–15.2)

## 2019-09-24 MED ORDER — BUTALBITAL-APAP-CAFFEINE 50-325-40 MG PO TABS
2.0000 | ORAL_TABLET | Freq: Four times a day (QID) | ORAL | Status: DC | PRN
  Administered 2019-09-24 – 2019-09-25 (×4): 2 via ORAL
  Filled 2019-09-24 (×4): qty 2

## 2019-09-24 MED ORDER — HYDROXYZINE HCL 25 MG PO TABS
25.0000 mg | ORAL_TABLET | Freq: Three times a day (TID) | ORAL | Status: DC | PRN
  Administered 2019-09-24: 25 mg via ORAL
  Filled 2019-09-24 (×2): qty 1

## 2019-09-24 NOTE — Progress Notes (Signed)
   09/23/19 2300  Vital Signs  BP (!) 170/88  Pulse Rate 62  ECG Heart Rate 75  Oxygen Therapy  SpO2 96 %   CNM [facility] notified of above vital signs and patient symptoms. Patient experiencing whole body shakiness, +2 reflexes, 1 beat clonus, and describes feeling hot and anxious. Patient had headache and epigastric pain prior to these vital signs. Patient denied visual changes or shortness of breath. Lung sounds were clear in all lung fields.   Vital signs fifteen minutes later were BP 151/88, HR 81, O2 100, no clonus, +1 reflexes, clear lung fields. Patient denied shortness of breath and shakiness decreased but still present. Patient described feeling more anxious, nausea, and tired.  [place] instructed this nurse to observe for additional changes in patient status and update if needed. Additionally, this RN was instructed to call lab and ensure 1800 lab work was completed and if not, reorder CBC and metabolic panel. Repeat CBC and metabolic panel in 8hrs.

## 2019-09-24 NOTE — Progress Notes (Addendum)
Patient ID: Sharon Mckay, female   DOB: [birth year], 38 y.o.   MRN: [id number]   Subjective: Patient is complaining of tingling sensation on lips, sore throat.  She also felt both her legs trembling.  She is anxious about her current state in the hospital wondering if she is going to be okay.  Her headache has improved with fioricet use but is still present.  Her chest pressure sensations have gone away.  She denies vision changes/ nausea/vomiting/chest pain/palpitations or abdominal pain.     Objective: I have reviewed patient's vital signs, intake and output, medications and labs. Vitals:  Vitals:   09/24/19 1200 09/24/19 1210 09/24/19 1535 09/24/19 1630  BP:  135/77 (!) 157/91   Pulse:  68 83   Resp: 16 18 18 18   Temp:  97.6 F (36.4 C) 98.6 F (37 C)   TempSrc:  Oral Oral   SpO2: 100% 100% 99%    Input:874 /8hrs (up to 1500 hrs today) Output: 2900 /8 hrs.  General: alert, cooperative. Teary when discussing how she was feeling.  Resp: clear to auscultation bilaterally Cardio: regular rate and rhythm, S1, S2 normal, no murmur, click, rub or gallop GI: soft, non-tender; bowel sounds normal; no masses,  no organomegaly.  Extremities: extremities normal, atraumatic and no cyanosis.  1+ edema BL in lower extremities.  2+ patellar reflex bilaterally.   Labs:  CBC    Component Value Date/Time   WBC 7.8 09/24/2019 1617   RBC 4.18 09/24/2019 1617   HGB 12.3 09/24/2019 1617   HCT 36.9 09/24/2019 1617   PLT 345 09/24/2019 1617   MCV 88.3 09/24/2019 1617   MCH 29.4 09/24/2019 1617   MCHC 33.3 09/24/2019 1617   RDW 13.1 09/24/2019 1617   CMP Latest Ref Rng & Units 09/24/2019 09/24/2019 09/23/2019  Glucose 70 - 99 mg/dL 114(H) 124(H) 101(H)  BUN 6 - 20 mg/dL 8 7 9   Creatinine 0.44 - 1.00 mg/dL 0.74 0.74 0.85  Sodium 135 - 145 mmol/L 139 139 140  Potassium 3.5 - 5.1 mmol/L 3.9 4.1 4.0  Chloride 98 - 111 mmol/L 103 104 104  CO2 22 - 32 mmol/L 25 26 24   Calcium 8.9 - 10.3 mg/dL  7.0(L) 7.1(L) 7.5(L)  Total Protein 6.5 - 8.1 g/dL 6.6 6.7 6.1(L)  Total Bilirubin 0.3 - 1.2 mg/dL 0.6 0.4 0.2(L)  Alkaline Phos 38 - 126 U/L 165(H) 153(H) 155(H)  AST 15 - 41 U/L 37 53(H) 68(H)  ALT 0 - 44 U/L 99(H) 112(H) 122(H)   Status:  Final result Visible to patient:  No (not released) Next appt:  None  Ref Range & Units 16:17 08:33  Magnesium 1.7 - 2.4 mg/dL 5.7High   5.7High  CM         Current Facility-Administered Medications:  .  acetaminophen (TYLENOL) tablet 650 mg, 650 mg, Oral, Q6H PRN, [place], Jade, FNP, 650 mg at 09/23/19 2024 .  alum & mag hydroxide-simeth (MAALOX/MYLANTA) 200-200-20 MG/5ML suspension 30 mL, 30 mL, Oral, Q6H PRN, Alesia Richards, Tully Burgo, MD .  butalbital-acetaminophen-caffeine (FIORICET) 50-325-40 MG per tablet 2 tablet, 2 tablet, Oral, Q6H PRN, [place], Luvenia Starch, FNP, 2 tablet at 09/24/19 1308 .  hydrALAZINE (APRESOLINE) injection 5 mg, 5 mg, Intravenous, PRN **AND** hydrALAZINE (APRESOLINE) injection 10 mg, 10 mg, Intravenous, PRN **AND** labetalol (NORMODYNE) injection 20 mg, 20 mg, Intravenous, PRN **AND** labetalol (NORMODYNE) injection 40 mg, 40 mg, Intravenous, PRN **AND** Measure blood pressure, , , Once, [place], [place], [place] .  hydrOXYzine (ATARAX/VISTARIL) tablet 25 mg, 25  mg, Oral, TID PRN, [place], Lesly Rubenstein, FNP, 25 mg at 09/24/19 1636 .  lactated ringers infusion, , Intravenous, Continuous, [place], [place], [place], Last Rate: 50 mL/hr at 09/24/19 0700 .  magnesium sulfate 40 grams in SWI 1000 mL OB infusion, 1 g/hr, Intravenous, Continuous, Sallye Ober, Jayton Popelka, MD, Last Rate: 25 mL/hr at 09/24/19 1551, 1 g/hr at 09/24/19 1551  Facility-Administered Medications Ordered in Other Encounters:  .  rho(d) immune globulin (RHIG/Rhophylac) injection 300 mcg, 300 mcg, Intramuscular, Once,   Assessment/Plan: 38y/o P4 PPD # 7 after a vaginal delivery readmitted for chest pressure symptoms and bradycardia (now resolved), found to have postpartum preeclampsia and on magnesium sulfate,  with history of anxiety  - Patient feels she would benefit from some medications to help her cope now, atarax ordered.  She does not feel that she needs long term medication at this time.   -Continue with magnesium sulfate for 48 hours as per MFM recommendations- until 09/25/19 1300.    -Decreased magnesium sulfate rate to 1 g/hr, magnesium level drawn and will redraw in the morning.  -c/w monitoring for signs and symptoms of magnesium sulfate toxicity as well as preeclampsia.   LOS: 2 days   Konrad Felix, MD.  09/24/2019.
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
## 2019-09-24 NOTE — Progress Notes (Signed)
   Progress Note  Patient Name: Nikol Lemar Date of Encounter: 09/24/2019  Primary Cardiologist: New  Subjective   No CP or dyspnea  Inpatient Medications    Scheduled Meds:  Continuous Infusions: . lactated ringers 50 mL/hr at 09/24/19 0700  . magnesium sulfate 2 g/hr (09/24/19 0618)   PRN Meds: acetaminophen, alum & mag hydroxide-simeth, butalbital-acetaminophen-caffeine, hydrALAZINE **AND** hydrALAZINE **AND** labetalol **AND** labetalol **AND** Measure blood pressure   Vital Signs    Vitals:   09/24/19 0401 09/24/19 0530 09/24/19 0655 09/24/19 0741  BP: (!) 143/67   (!) 144/82  Pulse: 68   69  Resp: 19   18  Temp:    97.8 F (36.6 C)  TempSrc:    Tympanic  SpO2: 95% 95% 100% 100%    Intake/Output Summary (Last 24 hours) at 09/24/2019 0928 Last data filed at 09/24/2019 0742 Gross per 24 hour  Intake 2600.54 ml  Output 5550 ml  Net -2949.46 ml   Last 3 Weights 09/15/2019 04/21/2012 02/29/2012  Weight (lbs) 260 lb 227 lb 245 lb  Weight (kg) 117.935 kg 102.967 kg 111.131 kg      Telemetry    Sinus to sinus bradycardia- Personally Reviewed  Physical Exam   GEN: No acute distress.  WD, WN Neck: supple Cardiac: RRR Respiratory: CTA GI: Soft, tender RUQ, no masses MS: No edema Neuro:  Grossly intact Psych: Normal affect   Labs      Chemistry Recent Labs  Lab 09/23/19 1022 09/23/19 2346 09/24/19 0833  NA 141 140 139  K 4.0 4.0 4.1  CL 106 104 104  CO2 25 24 26   GLUCOSE 94 101* 124*  BUN 11 9 7   CREATININE 0.66 0.85 0.74  CALCIUM 8.6* 7.5* 7.1*  PROT 6.1* 6.1* 6.7  ALBUMIN 2.8* 2.7* 2.9*  AST 81* 68* 53*  ALT 108* 122* 112*  ALKPHOS 146* 155* 153*  BILITOT 0.7 0.2* 0.4  GFRNONAA >60 >60 >60  GFRAA >60 >60 >60  ANIONGAP 10 12 9      Hematology Recent Labs  Lab 09/23/19 1022 09/23/19 2346 09/24/19 0833  WBC 7.9 7.6 7.2  RBC 3.62* 3.72* 4.00  HGB 10.7* 10.8* 11.8*  HCT 32.1* 33.2* 35.4*  MCV 88.7 89.2 88.5  MCH 29.6 29.0 29.5   MCHC 33.3 32.5 33.3  RDW 13.2 13.2 13.2  PLT 282 303 325    BNP Recent Labs  Lab 09/22/19 1923  BNP 220.3*      Patient Profile     38 y.o. female status post recent delivery with epigastric/rib pain.  Also with bradycardia with heart rate of 42.  Assessment & Plan    1 epigastric pain/rib pain-Symptoms resolved.  Enzymes negative.  Electrocardiogram shows marked sinus bradycardia but no clear ST changes.  Echocardiogram shows normal LV function.  Pain not consistent with cardiac etiology.  No plans for further evaluation.  2 bradycardia-resolved.  3 preeclampsia/right upper quadrant pain-further evaluation per primary care.  Cardiology will sign off.  Please call with questions.  For questions or updates, please contact [place] Please consult [email or web address] for contact info under        Signed, Kirk Ruths, MD  09/24/2019, 9:28 AM

## 2019-09-24 NOTE — Progress Notes (Signed)
Hospital day # 3 pregnancy at Unknown--Per H&P: Sharon Mckay is an 38 y.o. female P4 PPD # 6 after a vaginal delivery who presented with complaints of chest pressure and edema in lower extremities.  Chest pressure in sternum and epigastric region radiates to opposite side in the back.  She got mylanta in the MAU and the pressure decreased significantly, but is still present, now is more of a discomfort. Chest pressure would worsen with lying horizontal and improve with sitting or standing up.  Edema was pronounced more yesterday but has improved today with leg elevation.  She has had normal postpartum bleeding.  She denies headaches, changes in vision, nausea, vomiting.  She had some shortness of breath with certain positions such as lying down, bending over that would improve with sitting or standing up. Her pregnancy course was significant for AMA, BMI of 39.9.  Intrapartum and postpartum course was uncomplicated in the hospital..  (09/23/2019): Pt had elevated BP 150s/70s, doubled of LFT and dx with preeclampsia with severe features and started on magnesium.   S:  Pt in room in stable condition. Pt had anxiety yesterday afternoon about health and hospital situation, discussed with pt about start an SSRI medication, pt declined and stated her mood has been stable, pt denies SI/HI, and eventually stated she would be ok with having atarax for PRN anxiety medication. Pt endorses being on Paxil for 2 year but that was when she was 38 years old and doesn't feel like she needs it any more, pt informed Dr Sallye Ober that after I broke up the psych issues with PTSD she now feels that she is thinking about all that stuff when she endorses being fine and being over it. Pt left in stable condition this morning and resting well. Magnesium was decreased to 1gm/hr on 11/29, due to tingling of lips, but mag level was therapeutic at that time. Pt denies HA, pressure, or anxiety, pt endorses feeling better.   O: BP (!) 145/78  (BP Location: Left Arm)   Pulse (!) 58   Temp 97.6 F (36.4 C) (Oral)   Resp 17   SpO2 99%     Physical Exam  Constitutional: She is oriented to person, place, and time. She appears well-developed and well-nourished.  Head: Normocephalic and atraumatic.  Eyes: Conjunctivae and EOM are normal.  Neck: Normal range of motion. Neck supple.  Cardiovascular: regular rhythm, normal heart sounds and intact distal pulses.  Respiratory: Effort normal and breath sounds normal.  GI (Abdomen): Soft. She exhibits no mass. Mild tenderness in right upper quadrant and mid epigastric regions, no rebound, no guarding, neg Murphy's sign.   Genitourinary: Deferred. Musculoskeletal: Normal range of motion. No signs of DVT, neg hormones, no swelling noted to lower extremities.  Neurological: She is alert and oriented to person, place, and time. No clonus, 2+Patellar DTR.  Skin: Skin is warm and dry.  Psychiatric: She has a normal mood and affect. Judgment normal.  Denies SI/HI. Affect is flat.        Meds: Started on Magnesium 4gm bolus with 2g/hr to follow @ 1300 on 11/28, was decreased to 1gm/hr on 11/29 due to tingling of lips per Dr Sallye Ober.   A/P: Elpidio Galea, [id number], PP readmit PPD#9 now, LOS#3 today, was readmitted for chest pain, RUQ pain, bilateral LE edema, feeling no good, dx on 11/28 with preeclampsia with severe features.    Preeclampsia with severe features: Had elevated BP, HA, RUQ pain and elevated LFT which are trending downwards  now . Magnesium was started after consulting with Dr Gertie Exon with MFM on 11/28, Dr Dalbert Garnet with cardiology endorses consent for magnesium use if indicated with bradycardia, but bradycardia has resolved. LFT were 33/44 - 81/108 - 68/122 - 53/112 - 37/99 - currently 26/79, BP 148/78 with downward trend, continued RUQ pain pt was dx with PreE with SF and started on mag per MFM. PCR was undetectable on day one of admission and repeat was still undedectable. Monitor BP, neruo  s/sx, strict IO, hydralazine protocol if indicated with BP >160/110, magnesium for 48 hours per MFM, to be discontinued at 1200 today. Plan to continue mag, and reassess cmp and cbc every 12 hours. Mag level at 4.9   Bradycardia: Resolved HR in 60-70s, no syncope, denies SOB. Holter monitor in place and to be read by cardiology, cardiology was consulted and performed echo was unremarkable. Tropin's series unremarkable.  BNP elevated at 220.3 on 11/28. Cleared by cardiology today, d/c Holter monitor on 11/29.   RUQ Pain: neg murphy's sign, but tenderness under RUQ and epigastric region, little to no relief with GI cocktail. Unremarkable lipase and amylase. RUQ [place] results: Nonspecific gallbladder wall thickening (7 mm) with associated edema and pericholecystic fluid. No cholelithiasis. Possibility of acute cholecystitis among other etiologies is not entirely excluded. Pt will need to f/u out pt if pain reoccurs. May need GI referral.   H/O PTSD/Anxiety: Monitor mood, may need to start Zoloft to prevent PPD. Atarax PRN for anxiety  HA: Fioricet Q6H PRN for HA.  Hemorrhoids: Ordered stool softness and proctofoam.   Dr Charlesetta Garibaldi to assumed care and given report at 0700.    [facility] CNM, MSN 09/25/2019 6:24 AM

## 2019-09-24 NOTE — Progress Notes (Signed)
Called nurse into room because she stated that she felt as though her lips were burning, her throat felt scratchy, and her legs were shaking. VS and CBG obtained. No visible rash or hives. Patient states it has been a few hours since she ate a fruit plate and chicken noodle soup for lunch. Dr. Alesia Richards here to see patient on routine rounds; [facility] called and is coming to evaluate patient as well.
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
## 2019-09-25 LAB — COMPREHENSIVE METABOLIC PANEL
ALT: 79 U/L — ABNORMAL HIGH (ref 0–44)
ALT: 59 U/L — ABNORMAL HIGH (ref 0–44)
AST: 26 U/L (ref 15–41)
AST: 19 U/L (ref 15–41)
Albumin: 3 g/dL — ABNORMAL LOW (ref 3.5–5.0)
Albumin: 2.8 g/dL — ABNORMAL LOW (ref 3.5–5.0)
Alkaline Phosphatase: 162 U/L — ABNORMAL HIGH (ref 38–126)
Alkaline Phosphatase: 149 U/L — ABNORMAL HIGH (ref 38–126)
Anion gap: 13 (ref 5–15)
Anion gap: 12 (ref 5–15)
BUN: 8 mg/dL (ref 6–20)
BUN: 7 mg/dL (ref 6–20)
CO2: 25 mmol/L (ref 22–32)
CO2: 24 mmol/L (ref 22–32)
Calcium: 7.4 mg/dL — ABNORMAL LOW (ref 8.9–10.3)
Calcium: 7.4 mg/dL — ABNORMAL LOW (ref 8.9–10.3)
Chloride: 102 mmol/L (ref 98–111)
Chloride: 102 mmol/L (ref 98–111)
Creatinine, Ser: 0.83 mg/dL (ref 0.44–1.00)
Creatinine, Ser: 0.77 mg/dL (ref 0.44–1.00)
GFR calc Af Amer: >60 mL/min (ref >60)
GFR calc Af Amer: >60 mL/min (ref >60)
GFR calc non Af Amer: >60 mL/min (ref >60)
GFR calc non Af Amer: >60 mL/min (ref >60)
Glucose, Bld: 90 mg/dL (ref 70–99)
Glucose, Bld: 88 mg/dL (ref 70–99)
Potassium: 4.2 mmol/L (ref 3.5–5.1)
Potassium: 4.1 mmol/L (ref 3.5–5.1)
Sodium: 140 mmol/L (ref 135–145)
Sodium: 138 mmol/L (ref 135–145)
Total Bilirubin: 0.3 mg/dL (ref 0.3–1.2)
Total Bilirubin: 0.4 mg/dL (ref 0.3–1.2)
Total Protein: 6.5 g/dL (ref 6.5–8.1)
Total Protein: 6.2 g/dL — ABNORMAL LOW (ref 6.5–8.1)

## 2019-09-25 LAB — CBC
HCT: 40.4 % (ref 36.0–46.0)
Hemoglobin: 12.8 g/dL (ref 12.0–15.0)
MCH: 29.2 pg (ref 26.0–34.0)
MCHC: 31.7 g/dL (ref 30.0–36.0)
MCV: 92.2 fL (ref 80.0–100.0)
Platelets: 384 10*3/uL (ref 150–400)
RBC: 4.38 MIL/uL (ref 3.87–5.11)
RDW: 13.3 % (ref 11.5–15.5)
WBC: 8.1 10*3/uL (ref 4.0–10.5)
nRBC: 0 % (ref 0.0–0.2)

## 2019-09-25 LAB — MAGNESIUM: Magnesium: 4.9 mg/dL — ABNORMAL HIGH (ref 1.7–2.4)

## 2019-09-25 MED ORDER — HYDROCORT-PRAMOXINE (PERIANAL) 1-1 % EX FOAM
1.0000 | Freq: Two times a day (BID) | CUTANEOUS | Status: DC
  Administered 2019-09-25 – 2019-09-26 (×3): 1 via RECTAL
  Filled 2019-09-25 (×2): qty 10

## 2019-09-25 MED ORDER — NIFEDIPINE ER OSMOTIC RELEASE 30 MG PO TB24
30.0000 mg | ORAL_TABLET | Freq: Every day | ORAL | Status: DC
  Administered 2019-09-25 – 2019-09-26 (×2): 30 mg via ORAL
  Filled 2019-09-25 (×2): qty 1

## 2019-09-25 MED ORDER — SENNOSIDES-DOCUSATE SODIUM 8.6-50 MG PO TABS
1.0000 | ORAL_TABLET | Freq: Every day | ORAL | Status: DC
  Administered 2019-09-25: 1 via ORAL
  Filled 2019-09-25: qty 1

## 2019-09-25 NOTE — Progress Notes (Signed)
Pt denies SOB, abdominal pain or blurred vision BP (!) 148/89 (BP Location: Left Arm)   Pulse 67   Temp 98 F (36.7 C) (Oral)   Resp 17   SpO2 99%  Physical Examination: General appearance - alert, well appearing, and in no distress Mental status - alert, oriented to person, place, and time Chest - clear to auscultation, no wheezes, rales or rhonchi, symmetric air entry Heart - normal rate and regular rhythm Abdomen - soft, nontender, nondistended, no masses or organomegaly Neurological - alert, oriented, normal speech, no focal findings or movement disorder noted Extremities - peripheral pulses normal, no pedal edema, no clubbing or cyanosis, Homan's sign negative bilaterally Recent Results (from the past 2160 hour(s))  OB RESULT CONSOLE Group B Strep     Status: None   Collection Time: 08/23/19 12:00 AM  Result Value Ref Range   GBS Negative   OB RESULTS CONSOLE GC/Chlamydia     Status: None   Collection Time: 08/24/19 12:00 AM  Result Value Ref Range   Gonorrhea Negative    Chlamydia Negative   SARS CORONAVIRUS 2 (TAT 6-24 HRS) Nasopharyngeal Nasopharyngeal Swab     Status: None   Collection Time: 09/15/19 10:46 AM   Specimen: Nasopharyngeal Swab  Result Value Ref Range   SARS Coronavirus 2 NEGATIVE NEGATIVE    Comment: (NOTE) SARS-CoV-2 target nucleic acids are NOT DETECTED. The SARS-CoV-2 RNA is generally detectable in upper and lower respiratory specimens during the acute phase of infection. Negative results do not preclude SARS-CoV-2 infection, do not rule out co-infections with other pathogens, and should not be used as the sole basis for treatment or other patient management decisions. Negative results must be combined with clinical observations, patient history, and epidemiological information. The expected result is Negative. Fact Sheet for Patients: HairSlick.no Fact Sheet for Healthcare  Providers: quierodirigir.com This test is not yet approved or cleared by the [place] FDA and  has been authorized for detection and/or diagnosis of SARS-CoV-2 by FDA under an Emergency Use Authorization (EUA). This EUA will remain  in effect (meaning this test can be used) for the duration of the COVID-19 declaration under Section 56 4(b)(1) of the Act, 21 U.S.C. section 360bbb-3(b)(1), unless the authorization is terminated or revoked sooner. Performed at [facility], [address], [place], [place] [address]   CBC     Status: Abnormal   Collection Time: 09/15/19 10:46 AM  Result Value Ref Range   WBC 7.3 4.0 - 10.5 K/uL   RBC 3.62 (L) 3.87 - 5.11 MIL/uL   Hemoglobin 10.7 (L) 12.0 - 15.0 g/dL   HCT 60.4 (L) 54.0 - 98.1 %   MCV 92.0 80.0 - 100.0 fL   MCH 29.6 26.0 - 34.0 pg   MCHC 32.1 30.0 - 36.0 g/dL   RDW 19.1 47.8 - 29.5 %   Platelets 195 150 - 400 K/uL   nRBC 0.0 0.0 - 0.2 %    Comment: Performed at [facility], [address], [place], [place] [address]  Type and screen [facility]     Status: None   Collection Time: 09/15/19 10:46 AM  Result Value Ref Range   ABO/RH(D) A NEG    Antibody Screen POS    Sample Expiration 09/18/2019,2359    Antibody Identification PASSIVELY ACQUIRED ANTI-D    Unit Number [id number]    Blood Component Type RED CELLS,LR    Unit division 00    Status of Unit REL  FROM [facility]    Transfusion Status OK TO TRANSFUSE    Crossmatch Result COMPATIBLE    Unit Number [id number]    Blood Component Type RED CELLS,LR    Unit division 00    Status of Unit REL FROM [facility]    Transfusion Status OK TO TRANSFUSE    Crossmatch Result COMPATIBLE   RPR     Status: None   Collection Time: 09/15/19 10:46 AM  Result Value Ref Range   RPR Ser Ql NON REACTIVE NON REACTIVE    Comment: Performed at [facility], [address] [place], [place] [address]  BPAM RBC     Status: None    Collection Time: 09/15/19 10:46 AM  Result Value Ref Range   Blood Product Unit Number [id number]    Unit Type and Rh 0600    Blood Product Expiration Date [id number]    Blood Product Unit Number [id number]    Unit Type and Rh 0600    Blood Product Expiration Date 297989211941   CBC     Status: Abnormal   Collection Time: 09/17/19  4:46 AM  Result Value Ref Range   WBC 18.7 (H) 4.0 - 10.5 K/uL   RBC 3.43 (L) 3.87 - 5.11 MIL/uL   Hemoglobin 10.1 (L) 12.0 - 15.0 g/dL   HCT 74.0 (L) 81.4 - 48.1 %   MCV 90.7 80.0 - 100.0 fL   MCH 29.4 26.0 - 34.0 pg   MCHC 32.5 30.0 - 36.0 g/dL   RDW 85.6 31.4 - 97.0 %   Platelets 212 150 - 400 K/uL   nRBC 0.0 0.0 - 0.2 %    Comment: Performed at [facility], [address] [place], [place] [address]  Rh IG workup (includes ABO/Rh)     Status: None   Collection Time: 09/17/19  4:46 AM  Result Value Ref Range   Gestational Age(Wks) 40.4    ABO/RH(D) A NEG    Fetal Screen      NEG Performed at [facility], [address] [place], [place] [address]    Unit Number [id number]    Blood Component Type RHIG    Unit division 00    Status of Unit ISSUED,FINAL    Transfusion Status OK TO TRANSFUSE   Urinalysis, Routine w reflex microscopic     Status: Abnormal   Collection Time: 09/22/19  5:23 PM  Result Value Ref Range   Color, Urine STRAW (A) YELLOW   APPearance CLEAR CLEAR   Specific Gravity, Urine 1.003 (L) 1.005 - 1.030   pH 6.0 5.0 - 8.0   Glucose, UA NEGATIVE NEGATIVE mg/dL   Hgb urine dipstick LARGE (A) NEGATIVE   Bilirubin Urine NEGATIVE NEGATIVE   Ketones, ur NEGATIVE NEGATIVE mg/dL   Protein, ur NEGATIVE NEGATIVE mg/dL   Nitrite NEGATIVE NEGATIVE   Leukocytes,Ua TRACE (A) NEGATIVE   RBC / HPF 0-5 0 - 5 RBC/hpf   WBC, UA 0-5 0 - 5 WBC/hpf   Bacteria, UA RARE (A) NONE SEEN   Squamous Epithelial / LPF 0-5 0 - 5    Comment: Performed at [facility], [address], [place], [place] [address]  CBC      Status: Abnormal   Collection Time: 09/22/19  7:23 PM  Result Value Ref Range   WBC 9.5 4.0 - 10.5 K/uL   RBC 3.60 (L) 3.87 - 5.11 MIL/uL   Hemoglobin 10.6 (L) 12.0 - 15.0 g/dL   HCT 09.6 (L)  36.0 - 46.0 %   MCV 89.4 80.0 - 100.0 fL   MCH 29.4 26.0 - 34.0 pg   MCHC 32.9 30.0 - 36.0 g/dL   RDW 40.913.2 81.111.5 - 91.415.5 %   Platelets 272 150 - 400 K/uL   nRBC 0.0 0.0 - 0.2 %    Comment: Performed at [facility], [address] [place], [place] [address]  Comprehensive metabolic panel     Status: Abnormal   Collection Time: 09/22/19  7:23 PM  Result Value Ref Range   Sodium 141 135 - 145 mmol/L   Potassium 4.5 3.5 - 5.1 mmol/L   Chloride 107 98 - 111 mmol/L   CO2 25 22 - 32 mmol/L   Glucose, Bld 88 70 - 99 mg/dL   BUN 11 6 - 20 mg/dL   Creatinine, Ser 6.210.62 0.44 - 1.00 mg/dL   Calcium 9.3 8.9 - 30.810.3 mg/dL   Total Protein 5.9 (L) 6.5 - 8.1 g/dL   Albumin 2.7 (L) 3.5 - 5.0 g/dL   AST 33 15 - 41 U/L   ALT 44 0 - 44 U/L   Alkaline Phosphatase 153 (H) 38 - 126 U/L   Total Bilirubin 0.5 0.3 - 1.2 mg/dL   GFR calc non Af Amer >60 >60 mL/min   GFR calc Af Amer >60 >60 mL/min   Anion gap 9 5 - 15    Comment: Performed at [facility], [address] [place], [place] [address]  Brain natriuretic peptide     Status: Abnormal   Collection Time: 09/22/19  7:23 PM  Result Value Ref Range   B Natriuretic Peptide 220.3 (H) 0.0 - 100.0 pg/mL    Comment: Performed at [facility], [address] [place], [place] [address]  TSH     Status: None   Collection Time: 09/22/19  7:23 PM  Result Value Ref Range   TSH 1.254 0.350 - 4.500 uIU/mL    Comment: Performed by a 3rd Generation assay with a functional sensitivity of <=0.01 uIU/mL. Performed at [facility], [address] [place], [place] [address]   Protein / creatinine ratio, urine     Status: None   Collection Time: 09/22/19  7:30 PM  Result Value Ref Range   Creatinine, Urine 15.03 mg/dL   Total Protein, Urine <6.0  mg/dL    Comment: RESULT REPEATED AND VERIFIED NO NORMAL RANGE ESTABLISHED FOR THIS TEST    Protein Creatinine Ratio        0.00 - 0.15 mg/mg[Cre]    Comment: RESULT BELOW REPORTABLE RANGE, UNABLE TO CALCULATE. Performed at [facility], [address] [place], [place] [address]   ECHOCARDIOGRAM COMPLETE     Status: None   Collection Time: 09/23/19  8:08 AM  Result Value Ref Range   BP 153/78 mmHg  Troponin I (High Sensitivity)     Status: None   Collection Time: 09/23/19 10:13 AM  Result Value Ref Range   Troponin I (High Sensitivity) 5 <18 ng/L    Comment: (NOTE) Elevated high sensitivity troponin I (hsTnI) values and significant  changes across serial measurements may suggest ACS but many other  chronic and acute conditions are known to elevate hsTnI results.  Refer to the "Links" section for chest pain algorithms and additional  guidance. Performed at [facility], [address] [place], [place] [address]   CBC     Status: Abnormal   Collection Time: 09/23/19 10:22  AM  Result Value Ref Range   WBC 7.9 4.0 - 10.5 K/uL   RBC 3.62 (L) 3.87 - 5.11 MIL/uL   Hemoglobin 10.7 (L) 12.0 - 15.0 g/dL   HCT 32.1 (L) 36.0 - 46.0 %   MCV 88.7 80.0 - 100.0 fL   MCH 29.6 26.0 - 34.0 pg   MCHC 33.3 30.0 - 36.0 g/dL   RDW 13.2 11.5 - 15.5 %   Platelets 282 150 - 400 K/uL   nRBC 0.0 0.0 - 0.2 %    Comment: Performed at [facility], [place] [address] [place], [place] [address]  Comprehensive metabolic panel     Status: Abnormal   Collection Time: 09/23/19 10:22 AM  Result Value Ref Range   Sodium 141 135 - 145 mmol/L   Potassium 4.0 3.5 - 5.1 mmol/L   Chloride 106 98 - 111 mmol/L   CO2 25 22 - 32 mmol/L   Glucose, Bld 94 70 - 99 mg/dL   BUN 11 6 - 20 mg/dL   Creatinine, Ser 0.66 0.44 - 1.00 mg/dL   Calcium 8.6 (L) 8.9 - 10.3 mg/dL   Total Protein 6.1 (L) 6.5 - 8.1 g/dL   Albumin 2.8 (L) 3.5 - 5.0 g/dL   AST 81 (H) 15 - 41 U/L   ALT 108 (H) 0 - 44 U/L    Alkaline Phosphatase 146 (H) 38 - 126 U/L   Total Bilirubin 0.7 0.3 - 1.2 mg/dL   GFR calc non Af Amer >60 >60 mL/min   GFR calc Af Amer >60 >60 mL/min   Anion gap 10 5 - 15    Comment: Performed at [facility], [place] [address], [place], [place] [address]  Amylase     Status: None   Collection Time: 09/23/19 10:22 AM  Result Value Ref Range   Amylase 32 28 - 100 U/L    Comment: Performed at [place] [address], [place], [place] [address]  Lipase, blood     Status: None   Collection Time: 09/23/19 10:22 AM  Result Value Ref Range   Lipase 25 11 - 51 U/L    Comment: Performed at [place] [address], [place], [place] [address]  Troponin I (High Sensitivity)     Status: None   Collection Time: 09/23/19 12:13 PM  Result Value Ref Range   Troponin I (High Sensitivity) 4 <18 ng/L    Comment: (NOTE) Elevated high sensitivity troponin I (hsTnI) values and significant  changes across serial measurements may suggest ACS but many other  chronic and acute conditions are known to elevate hsTnI results.  Refer to the "Links" section for chest pain algorithms and additional  guidance. Performed at [facility], [place] [address], [place], [place] [address]   Protein / creatinine ratio, urine     Status: None   Collection Time: 09/23/19 12:59 PM  Result Value Ref Range   Creatinine, Urine 72.78 mg/dL   Total Protein, Urine <6 mg/dL   Protein Creatinine Ratio        0.00 - 0.15 mg/mg[Cre]    Comment: RESULT BELOW REPORTABLE RANGE, UNABLE TO CALCULATE. Performed at [facility], [place] [address], [place], [place] [address]   Comprehensive metabolic panel     Status: Abnormal   Collection Time: 09/23/19 11:46 PM  Result Value Ref Range   Sodium 140 135 - 145 mmol/L   Potassium 4.0 3.5 - 5.1 mmol/L  Chloride 104 98 - 111 mmol/L   CO2 24 22 - 32 mmol/L   Glucose, Bld 101 (H) 70 - 99 mg/dL   BUN 9 6 - 20 mg/dL   Creatinine, Ser 8.29 0.44 - 1.00  mg/dL   Calcium 7.5 (L) 8.9 - 10.3 mg/dL   Total Protein 6.1 (L) 6.5 - 8.1 g/dL   Albumin 2.7 (L) 3.5 - 5.0 g/dL   AST 68 (H) 15 - 41 U/L   ALT 122 (H) 0 - 44 U/L   Alkaline Phosphatase 155 (H) 38 - 126 U/L   Total Bilirubin 0.2 (L) 0.3 - 1.2 mg/dL   GFR calc non Af Amer >60 >60 mL/min   GFR calc Af Amer >60 >60 mL/min   Anion gap 12 5 - 15    Comment: Performed at [facility], [address] [place], [place] [address]  CBC     Status: Abnormal   Collection Time: 09/23/19 11:46 PM  Result Value Ref Range   WBC 7.6 4.0 - 10.5 K/uL   RBC 3.72 (L) 3.87 - 5.11 MIL/uL   Hemoglobin 10.8 (L) 12.0 - 15.0 g/dL   HCT 08.6 (L) 57.8 - 46.9 %   MCV 89.2 80.0 - 100.0 fL   MCH 29.0 26.0 - 34.0 pg   MCHC 32.5 30.0 - 36.0 g/dL   RDW 62.9 52.8 - 41.3 %   Platelets 303 150 - 400 K/uL   nRBC 0.0 0.0 - 0.2 %    Comment: Performed at [facility], [address] [place], [place] [address]  Uric acid     Status: None   Collection Time: 09/23/19 11:46 PM  Result Value Ref Range   Uric Acid, Serum 6.6 2.5 - 7.1 mg/dL    Comment: Performed at [facility], [address], [place], [place] [address]  Lactate dehydrogenase     Status: Abnormal   Collection Time: 09/23/19 11:46 PM  Result Value Ref Range   LDH 219 (H) 98 - 192 U/L    Comment: Performed at [facility], [address], [place], [place] [address]  CBC     Status: Abnormal   Collection Time: 09/24/19  8:33 AM  Result Value Ref Range   WBC 7.2 4.0 - 10.5 K/uL   RBC 4.00 3.87 - 5.11 MIL/uL   Hemoglobin 11.8 (L) 12.0 - 15.0 g/dL   HCT 03.4 (L) 74.2 - 59.5 %   MCV 88.5 80.0 - 100.0 fL   MCH 29.5 26.0 - 34.0 pg   MCHC 33.3 30.0 - 36.0 g/dL   RDW 63.8 75.6 - 43.3 %   Platelets 325 150 - 400 K/uL   nRBC 0.0 0.0 - 0.2 %    Comment: Performed at [facility], [address] [place], [place] [address]  Comprehensive metabolic panel     Status: Abnormal   Collection Time: 09/24/19  8:33 AM  Result Value Ref  Range   Sodium 139 135 - 145 mmol/L   Potassium 4.1 3.5 - 5.1 mmol/L   Chloride 104 98 - 111 mmol/L   CO2 26 22 - 32 mmol/L   Glucose, Bld 124 (H) 70 - 99 mg/dL   BUN 7 6 - 20 mg/dL   Creatinine, Ser 8.41 0.44 - 1.00 mg/dL   Calcium 7.1 (L) 8.9 - 10.3 mg/dL   Total Protein 6.7 6.5 - 8.1 g/dL   Albumin 2.9 (L) 3.5 - 5.0 g/dL   AST  53 (H) 15 - 41 U/L   ALT 112 (H) 0 - 44 U/L   Alkaline Phosphatase 153 (H) 38 - 126 U/L   Total Bilirubin 0.4 0.3 - 1.2 mg/dL   GFR calc non Af Amer >60 >60 mL/min   GFR calc Af Amer >60 >60 mL/min   Anion gap 9 5 - 15    Comment: Performed at [facility], [address] [place], [place] [address]  Magnesium     Status: Abnormal   Collection Time: 09/24/19  8:33 AM  Result Value Ref Range   Magnesium 5.7 (H) 1.7 - 2.4 mg/dL    Comment: Performed at [facility], [address] [place], [place] [address]  Fibrinogen     Status: None   Collection Time: 09/24/19  9:42 AM  Result Value Ref Range   Fibrinogen 347 210 - 475 mg/dL    Comment: Performed at [facility], [address], [place], [place] [address]  APTT     Status: None   Collection Time: 09/24/19  9:42 AM  Result Value Ref Range   aPTT 26 24 - 36 seconds    Comment: Performed at [facility], [address], [place], [place] [address]  Protime-INR     Status: None   Collection Time: 09/24/19  9:42 AM  Result Value Ref Range   Prothrombin Time 13.5 11.4 - 15.2 seconds   INR 1.0 0.8 - 1.2    Comment: (NOTE) INR goal varies based on device and disease states. Performed at [facility], [address] [place], [place] [address]   Glucose, capillary     Status: Abnormal   Collection Time: 09/24/19  3:44 PM  Result Value Ref Range   Glucose-Capillary 113 (H) 70 - 99 mg/dL  Magnesium     Status: Abnormal   Collection Time: 09/24/19  4:17 PM  Result Value Ref Range   Magnesium 5.7 (H) 1.7 - 2.4 mg/dL    Comment: Performed at [facility], [address] [place], [place] [address]  Comprehensive metabolic panel     Status: Abnormal   Collection Time: 09/24/19  4:17 PM  Result Value Ref Range   Sodium 139 135 - 145 mmol/L   Potassium 3.9 3.5 - 5.1 mmol/L   Chloride 103 98 - 111 mmol/L   CO2 25 22 - 32 mmol/L   Glucose, Bld 114 (H) 70 - 99 mg/dL   BUN 8 6 - 20 mg/dL   Creatinine, Ser 4.09 0.44 - 1.00 mg/dL   Calcium 7.0 (L) 8.9 - 10.3 mg/dL   Total Protein 6.6 6.5 - 8.1 g/dL   Albumin 2.9 (L) 3.5 - 5.0 g/dL   AST 37 15 - 41 U/L   ALT 99 (H) 0 - 44 U/L   Alkaline Phosphatase 165 (H) 38 - 126 U/L   Total Bilirubin 0.6 0.3 - 1.2 mg/dL   GFR calc non Af Amer >60 >60 mL/min   GFR calc Af Amer >60 >60 mL/min   Anion gap 11 5 - 15    Comment: Performed at [facility], [address] [place], [place] [address]  CBC     Status: None   Collection Time: 09/24/19  4:17 PM  Result Value Ref Range   WBC 7.8 4.0 - 10.5 K/uL   RBC 4.18 3.87 - 5.11 MIL/uL   Hemoglobin 12.3 12.0 - 15.0 g/dL   HCT 99.2 42.6 -  46.0 %   MCV 88.3 80.0 - 100.0 fL   MCH 29.4 26.0 - 34.0 pg   MCHC 33.3 30.0 - 36.0 g/dL   RDW 69.6 29.5 - 28.4 %   Platelets 345 150 - 400 K/uL   nRBC 0.0 0.0 - 0.2 %    Comment: Performed at [facility], [address], [place], [place] [address]  Comprehensive metabolic panel     Status: Abnormal   Collection Time: 09/25/19  4:12 AM  Result Value Ref Range   Sodium 140 135 - 145 mmol/L   Potassium 4.2 3.5 - 5.1 mmol/L   Chloride 102 98 - 111 mmol/L   CO2 25 22 - 32 mmol/L   Glucose, Bld 90 70 - 99 mg/dL   BUN 8 6 - 20 mg/dL   Creatinine, Ser 0.10 0.44 - 1.00 mg/dL   Calcium 7.4 (L) 8.9 - 10.3 mg/dL   Total Protein 6.5 6.5 - 8.1 g/dL   Albumin 3.0 (L) 3.5 - 5.0 g/dL   AST 26 15 - 41 U/L   ALT 79 (H) 0 - 44 U/L   Alkaline Phosphatase 162 (H) 38 - 126 U/L   Total Bilirubin 0.3 0.3 - 1.2 mg/dL   GFR calc non Af Amer >60 >60 mL/min   GFR calc Af Amer >60 >60 mL/min   Anion gap 13 5 - 15    Comment: Performed  at [facility], [address], [place], [place] [address]  CBC     Status: None   Collection Time: 09/25/19  4:12 AM  Result Value Ref Range   WBC 8.1 4.0 - 10.5 K/uL   RBC 4.38 3.87 - 5.11 MIL/uL   Hemoglobin 12.8 12.0 - 15.0 g/dL   HCT 66.4 40.3 - 47.4 %   MCV 92.2 80.0 - 100.0 fL   MCH 29.2 26.0 - 34.0 pg   MCHC 31.7 30.0 - 36.0 g/dL   RDW 25.9 56.3 - 87.5 %   Platelets 384 150 - 400 K/uL   nRBC 0.0 0.0 - 0.2 %    Comment: Performed at [facility], [address] [place], [place] [address]  Magnesium     Status: Abnormal   Collection Time: 09/25/19  4:12 AM  Result Value Ref Range   Magnesium 4.9 (H) 1.7 - 2.4 mg/dL    Comment: Performed at [facility], [address], [place], [place] [address]     PP Preeclampsia with HELLP  Pt is improving Will start procardia for elevated blood pressures Labs and pt improving  Possible discharge tomorrow

## 2019-09-26 LAB — COMPREHENSIVE METABOLIC PANEL
ALT: 43 U/L (ref 0–44)
AST: 15 U/L (ref 15–41)
Albumin: 2.7 g/dL — ABNORMAL LOW (ref 3.5–5.0)
Alkaline Phosphatase: 126 U/L (ref 38–126)
Anion gap: 8 (ref 5–15)
BUN: 10 mg/dL (ref 6–20)
CO2: 24 mmol/L (ref 22–32)
Calcium: 8.1 mg/dL — ABNORMAL LOW (ref 8.9–10.3)
Chloride: 107 mmol/L (ref 98–111)
Creatinine, Ser: 0.82 mg/dL (ref 0.44–1.00)
GFR calc Af Amer: >60 mL/min (ref >60)
GFR calc non Af Amer: >60 mL/min (ref >60)
Glucose, Bld: 86 mg/dL (ref 70–99)
Potassium: 4.2 mmol/L (ref 3.5–5.1)
Sodium: 139 mmol/L (ref 135–145)
Total Bilirubin: 0.6 mg/dL (ref 0.3–1.2)
Total Protein: 5.8 g/dL — ABNORMAL LOW (ref 6.5–8.1)

## 2019-09-26 MED ORDER — NIFEDIPINE ER 30 MG PO TB24: 30.0000 mg | ORAL_TABLET | Freq: Every day | ORAL | 0 refills | Status: DC

## 2019-09-26 MED ORDER — SENNOSIDES-DOCUSATE SODIUM 8.6-50 MG PO TABS: 1.0000 | ORAL_TABLET | Freq: Every day | ORAL | Status: DC

## 2019-09-26 MED ORDER — ACETAMINOPHEN 325 MG PO TABS: 650.0000 mg | ORAL_TABLET | Freq: Four times a day (QID) | ORAL | Status: DC | PRN

## 2019-09-26 NOTE — Progress Notes (Signed)
Hospital Day #4, PPD #10 S/P SVD re-admitted for Pre-eclampsia w/ HELLP syndrome  S:   Reports feeling "much better", swelling is reduced and HA is gone Tolerating PO fluid and solids HA well-controlled with Fioricet Up ad lib / ambulatory / voiding w/o difficulty  O:   VS: BP 137/72 (BP Location: Left Arm)   Pulse (!) 57   Temp 98.2 F (36.8 C) (Oral)   Resp 18   SpO2 100%   LABS:  CMP Latest Ref Rng & Units 09/26/2019 09/25/2019 09/25/2019  Glucose 70 - 99 mg/dL 86 88 90  BUN 6 - 20 mg/dL 10 7 8   Creatinine 0.44 - 1.00 mg/dL 0.82 0.77 0.83  Sodium 135 - 145 mmol/L 139 138 140  Potassium 3.5 - 5.1 mmol/L 4.2 4.1 4.2  Chloride 98 - 111 mmol/L 107 102 102  CO2 22 - 32 mmol/L 24 24 25   Calcium 8.9 - 10.3 mg/dL 8.1(L) 7.4(L) 7.4(L)  Total Protein 6.5 - 8.1 g/dL 5.8(L) 6.2(L) 6.5  Total Bilirubin 0.3 - 1.2 mg/dL 0.6 0.4 0.3  Alkaline Phos 38 - 126 U/L 126 149(H) 162(H)  AST 15 - 41 U/L 15 19 26   ALT 0 - 44 U/L 43 59(H) 79(H)                      I&O: Intake/Output      11/30 0701 - 12/01 0700   P.O. 2800   I.V. 946.6   Total Intake 3746.6   Urine 5000   Total Output 5000   Net -1253.4         Physical Exam: Alert and oriented X3 Lungs: Clear and unlabored Heart: regular rate and rhythm / no mumurs Abdomen: soft, non-tender, non-distended  Extremities: trace, non-pitting edema, no calf pain or tenderness Neuro: +1 reflexes, negative clonus    A/P:  PPD #10 pre-eclampsia w/ HELLP syndrome     -S/P MgSO4 x 48 hours    -started on Procardia XL 30mg  PO on 11/30, continue @ DC    -normotensive to mid-range BP in past 24 hrs    -LFT's continue to improve    -DC today, follow-up for BP check in 1 week  Hx PTSD and anxiety    -mood stable, F/U in 1 week in office  Bradycardia    -resolved  RUQ pain    -resolved  HA    -resolved  Hemorrhoids    -stable, medications effective  Plan reviewed w/ Dr. Burna Cash, MSN, CNM 09/26/2019, 6:17 AM

## 2019-09-26 NOTE — Progress Notes (Signed)
Ambulated out with family teaching complete

## 2019-09-26 NOTE — Discharge Summary (Signed)
OB Discharge Summary     Patient Name: Sharon Mckay DOB: [birth year] MRN: [id number]  Date of admission: 09/22/2019 Admitting MD: Dr. Hoover Browns  Date of discharge: 09/26/2019  Admitting diagnosis: PP HBP Eval   Secondary diagnosis:  Active Problems:   Chest pressure   Severe pre-eclampsia, postpartum condition or complication   Bradycardia  Additional problems: HELLP syndrome, PTSD, Anxiety     Discharge diagnosis: Preeclampsia (severe) w/ HELLP syndrome                                                                                              Post partum procedures:Magnesium Sulfate  Hospital course:  Presented w/ chest pressure and bradycardia. GI work-up ruled out cholelithiasis. Lab results revealed PP pre-eclampsia w/ severe features for elevated LFT's, epigastric pain, and headache. S/P MgSO4 x48 hours and started on oral antihypertensives.    Vitals:   09/25/19 1600 09/25/19 1934 09/25/19 2327 09/26/19 0305  BP:  (!) 142/76 131/80 137/72  Pulse:  64 (!) 56 (!) 57  Resp:  18 18 18   Temp:  98.7 F (37.1 C) 98.2 F (36.8 C) 98.2 F (36.8 C)  TempSrc:  Oral Oral Oral  SpO2: 98% 100% 100% 100%   Physical Exam: Alert and oriented X3 Lungs: Clear and unlabored Heart: regular rate and rhythm / no mumurs Abdomen: soft, non-tender, non-distended  Extremities: trace, non-pitting edema, no calf pain or tenderness Neuro: +1 reflexes, negative clonus Labs: Lab Results  Component Value Date   WBC 8.1 09/25/2019   HGB 12.8 09/25/2019   HCT 40.4 09/25/2019   MCV 92.2 09/25/2019   PLT 384 09/25/2019   CMP Latest Ref Rng & Units 09/26/2019  Glucose 70 - 99 mg/dL 86  BUN 6 - 20 mg/dL 10  Creatinine 14/10/2018 - 7.98 mg/dL 9.21  Sodium 1.94 - 174 mmol/L 139  Potassium 3.5 - 5.1 mmol/L 4.2  Chloride 98 - 111 mmol/L 107  CO2 22 - 32 mmol/L 24  Calcium 8.9 - 10.3 mg/dL 8.1(L)  Total Protein 6.5 - 8.1 g/dL 081)  Total Bilirubin 0.3 - 1.2 mg/dL 0.6  Alkaline Phos 38 -  126 U/L 126  AST 15 - 41 U/L 15  ALT 0 - 44 U/L 43    Discharge instruction: per After Visit Summary   After visit meds:  Allergies as of 09/26/2019      Reactions   Amoxicillin Shortness Of Breath   Penicillins Shortness Of Breath   Did it involve swelling of the face/tongue/throat, SOB, or low BP? Yes Did it involve sudden or severe rash/hives, skin peeling, or any reaction on the inside of your mouth or nose? Yes Did you need to seek medical attention at a hospital or doctor's office? Yes When did it last happen?6 yrsago If all above answers are "NO", may proceed with cephalosporin use.   Sulfur Hives, Shortness Of Breath   Raspberry       Medication List    TAKE these medications   acetaminophen 500 MG tablet Commonly known as: TYLENOL Take 2 tablets (1,000 mg total) by mouth every 6 (six) hours  as needed. What changed: Another medication with the same name was added. Make sure you understand how and when to take each.   acetaminophen 325 MG tablet Commonly known as: TYLENOL Take 2 tablets (650 mg total) by mouth every 6 (six) hours as needed for mild pain. What changed: You were already taking a medication with the same name, and this prescription was added. Make sure you understand how and when to take each.   benzocaine-Menthol 20-0.5 % Aero Commonly known as: DERMOPLAST Apply 1 application topically as needed for irritation (perineal discomfort).   coconut oil Oil Apply 1 application topically as needed.   docusate sodium 100 MG capsule Commonly known as: COLACE Take 100 mg by mouth daily as needed for constipation.   ibuprofen 600 MG tablet Commonly known as: ADVIL Take 1 tablet (600 mg total) by mouth every 6 (six) hours.   NIFEdipine 30 MG 24 hr tablet Commonly known as: ADALAT CC Take 1 tablet (30 mg total) by mouth daily.   prenatal multivitamin Tabs tablet Take 1 tablet by mouth daily.   Proctofoam HC rectal foam Generic drug:  hydrocortisone-pramoxine Place 1 application rectally 3 (three) times daily.   senna-docusate 8.6-50 MG tablet Commonly known as: Senokot-S Take 1 tablet by mouth at bedtime.   TUMS PO Take 1-2 tablets by mouth daily as needed (heartburn).       Diet: Heart Healthy diet  Activity: Advance as tolerated. Pelvic rest for 6 weeks.   Outpatient follow up: Return to [facility] in 1 week for BP check  Follow up Appt:No future appointments. Follow up Visit:No follow-ups on file.  Postpartum contraception: Not Discussed   09/26/2019 Arrie Eastern, CNM

## 2019-10-16 ENCOUNTER — Encounter (HOSPITAL_COMMUNITY): Payer: Self-pay | Admitting: Obstetrics and Gynecology

## 2019-10-16 ENCOUNTER — Inpatient Hospital Stay (HOSPITAL_COMMUNITY): Payer: Medicaid Other

## 2019-10-16 ENCOUNTER — Inpatient Hospital Stay (HOSPITAL_COMMUNITY)
Admission: AD | Admit: 2019-10-16 | Discharge: 2019-10-16 | Disposition: A | Discharge status: Home / Self Care | Payer: Medicaid Other | Attending: Obstetrics and Gynecology | Admitting: Obstetrics and Gynecology

## 2019-10-16 ENCOUNTER — Other Ambulatory Visit: Payer: Self-pay

## 2019-10-16 DIAGNOSIS — Z91018 Allergy to other foods: Secondary | ICD-10-CM | POA: Insufficient documentation

## 2019-10-16 DIAGNOSIS — Z882 Allergy status to sulfonamides status: Secondary | ICD-10-CM | POA: Insufficient documentation

## 2019-10-16 DIAGNOSIS — O9089 Other complications of the puerperium, not elsewhere classified: Secondary | ICD-10-CM

## 2019-10-16 DIAGNOSIS — Z880 Allergy status to penicillin: Secondary | ICD-10-CM | POA: Diagnosis not present

## 2019-10-16 DIAGNOSIS — Z79899 Other long term (current) drug therapy: Secondary | ICD-10-CM | POA: Insufficient documentation

## 2019-10-16 DIAGNOSIS — Z803 Family history of malignant neoplasm of breast: Secondary | ICD-10-CM | POA: Insufficient documentation

## 2019-10-16 DIAGNOSIS — R1011 Right upper quadrant pain: Secondary | ICD-10-CM | POA: Insufficient documentation

## 2019-10-16 DIAGNOSIS — Z801 Family history of malignant neoplasm of trachea, bronchus and lung: Secondary | ICD-10-CM | POA: Insufficient documentation

## 2019-10-16 DIAGNOSIS — O99891 Other specified diseases and conditions complicating pregnancy: Secondary | ICD-10-CM | POA: Diagnosis present

## 2019-10-16 LAB — CBC
HCT: 38.5 % (ref 36.0–46.0)
Hemoglobin: 12.3 g/dL (ref 12.0–15.0)
MCH: 28.7 pg (ref 26.0–34.0)
MCHC: 31.9 g/dL (ref 30.0–36.0)
MCV: 90 fL (ref 80.0–100.0)
Platelets: 291 10*3/uL (ref 150–400)
RBC: 4.28 MIL/uL (ref 3.87–5.11)
RDW: 12.8 % (ref 11.5–15.5)
WBC: 6.4 10*3/uL (ref 4.0–10.5)
nRBC: 0 % (ref 0.0–0.2)

## 2019-10-16 LAB — BASIC METABOLIC PANEL
Anion gap: 8 (ref 5–15)
BUN: 8 mg/dL (ref 6–20)
CO2: 25 mmol/L (ref 22–32)
Calcium: 8.9 mg/dL (ref 8.9–10.3)
Chloride: 106 mmol/L (ref 98–111)
Creatinine, Ser: 0.77 mg/dL (ref 0.44–1.00)
GFR calc Af Amer: >60 mL/min (ref >60)
GFR calc non Af Amer: >60 mL/min (ref >60)
Glucose, Bld: 98 mg/dL (ref 70–99)
Potassium: 3.9 mmol/L (ref 3.5–5.1)
Sodium: 139 mmol/L (ref 135–145)

## 2019-10-16 LAB — URINALYSIS, ROUTINE W REFLEX MICROSCOPIC
Bilirubin Urine: NEGATIVE
Glucose, UA: NEGATIVE mg/dL
Hgb urine dipstick: NEGATIVE
Ketones, ur: NEGATIVE mg/dL
Nitrite: NEGATIVE
Protein, ur: NEGATIVE mg/dL
Specific Gravity, Urine: 1.006 (ref 1.005–1.030)
pH: 6 (ref 5.0–8.0)

## 2019-10-16 LAB — LIPASE, BLOOD: Lipase: 25 U/L (ref 11–51)

## 2019-10-16 MED ORDER — ALUM & MAG HYDROXIDE-SIMETH 200-200-20 MG/5ML PO SUSP
30.0000 mL | Freq: Once | ORAL | Status: AC
  Administered 2019-10-16: 30 mL via ORAL
  Filled 2019-10-16: qty 30
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
## 2019-10-16 NOTE — MAU Note (Signed)
Had pre-eclampsia after del. (delivered 11/21) Since then has been on BP meds and has been eating really healthy. Yesterday she ate something that she probably shouldn't. (croissant w/cream cheese and bacon), since then has been having pain in RUQ, hurts to move, int sharp pains. Denies nausea.

## 2019-10-16 NOTE — Discharge Instructions (Signed)
Abdominal Pain, Adult    Many things can cause belly (abdominal) pain. Most times, belly pain is not dangerous. Many cases of belly pain can be watched and treated at home. Sometimes belly pain is serious, though. Your doctor will try to find the cause of your belly pain.  Follow these instructions at home:  · Take over-the-counter and prescription medicines only as told by your doctor. Do not take medicines that help you poop (laxatives) unless told to by your doctor.  · Drink enough fluid to keep your pee (urine) clear or pale yellow.  · Watch your belly pain for any changes.  · Keep all follow-up visits as told by your doctor. This is important.  Contact a doctor if:  · Your belly pain changes or gets worse.  · You are not hungry, or you lose weight without trying.  · You are having trouble pooping (constipated) or have watery poop (diarrhea) for more than 2-3 days.  · You have pain when you pee or poop.  · Your belly pain wakes you up at night.  · Your pain gets worse with meals, after eating, or with certain foods.  · You are throwing up and cannot keep anything down.  · You have a fever.  Get help right away if:  · Your pain does not go away as soon as your doctor says it should.  · You cannot stop throwing up.  · Your pain is only in areas of your belly, such as the right side or the left lower part of the belly.  · You have bloody or black poop, or poop that looks like tar.  · You have very bad pain, cramping, or bloating in your belly.  · You have signs of not having enough fluid or water in your body (dehydration), such as:  ? Dark pee, very little pee, or no pee.  ? Cracked lips.  ? Dry mouth.  ? Sunken eyes.  ? Sleepiness.  ? Weakness.  This information is not intended to replace advice given to you by your health care provider. Make sure you discuss any questions you have with your health care provider.  Document Released: 03/30/2008 Document Revised: 05/01/2016 Document Reviewed: 03/25/2016  Elsevier  Interactive Patient Education © 2020 [employer]

## 2021-04-04 IMAGING — US US ABDOMEN LIMITED
1 series · 15 of 25 positions shown · non-contrast
Comparison: None.

CLINICAL DATA: Right upper quadrant abdominal pain.

EXAM:
ULTRASOUND ABDOMEN LIMITED RIGHT UPPER QUADRANT

[Series 1: us abdomen limited · 15 of 81 slices shown]
[im 1/81]
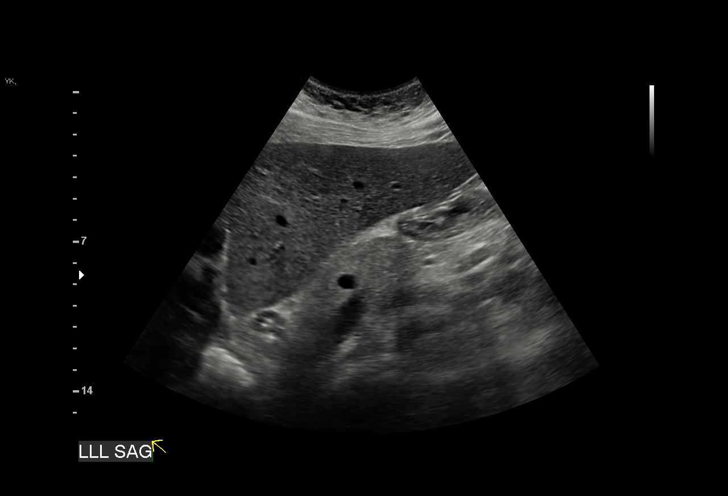
[im 7/81]
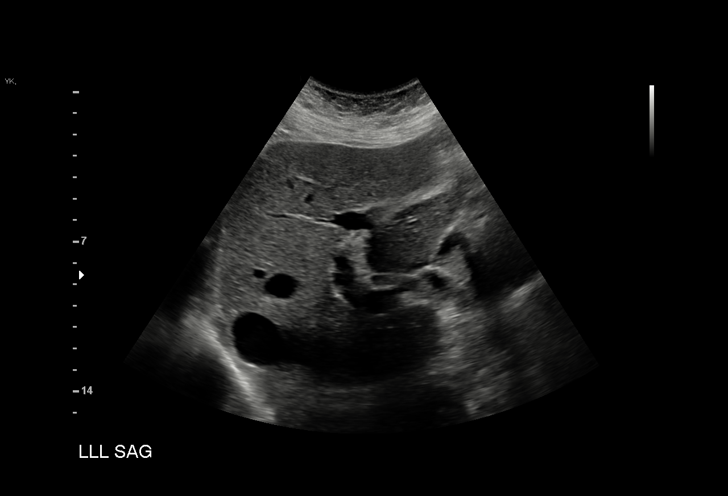
[im 14/81]
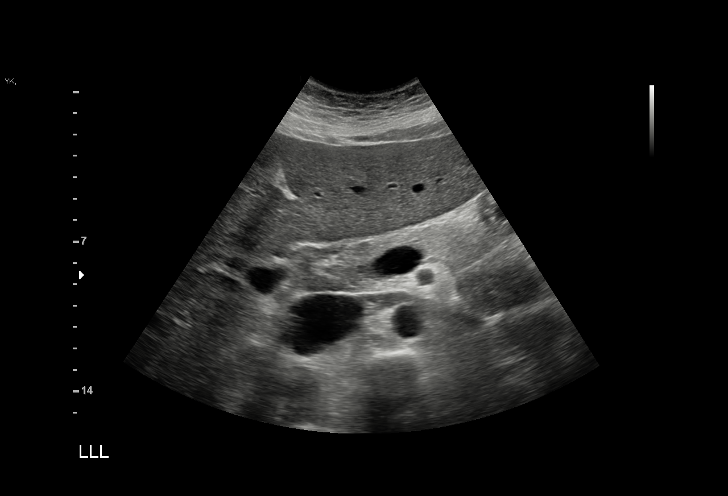
[im 17/81]
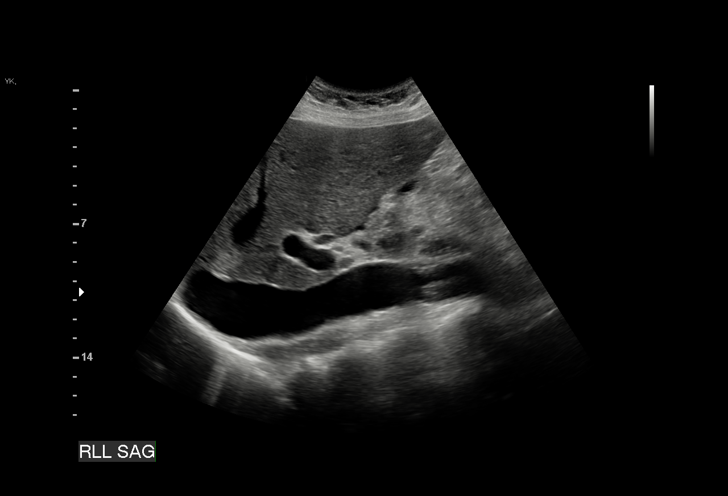
[im 24/81]
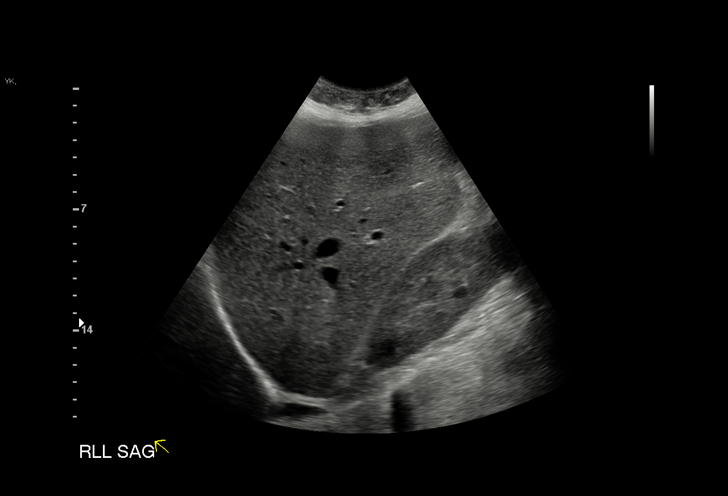
[im 31/81]
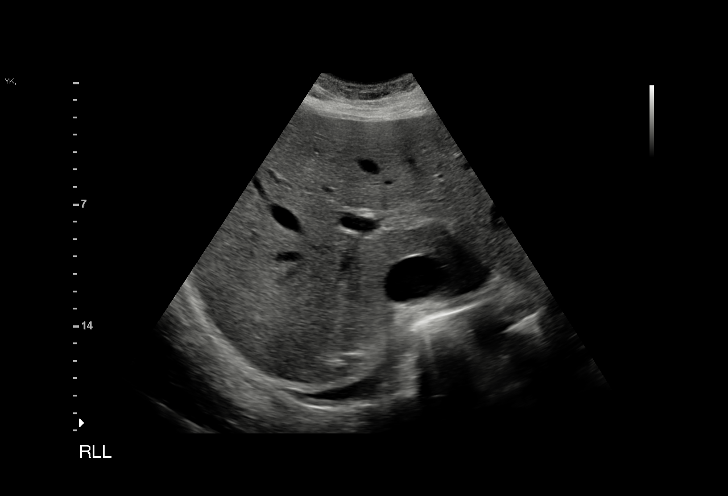
[im 34/81]
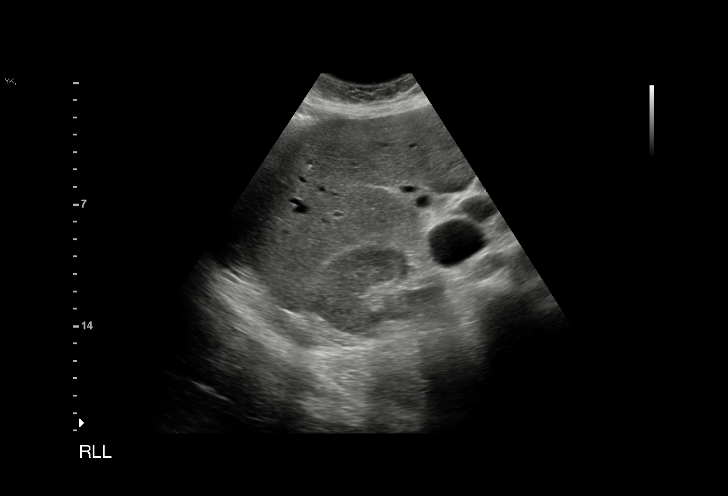
[im 41/81]
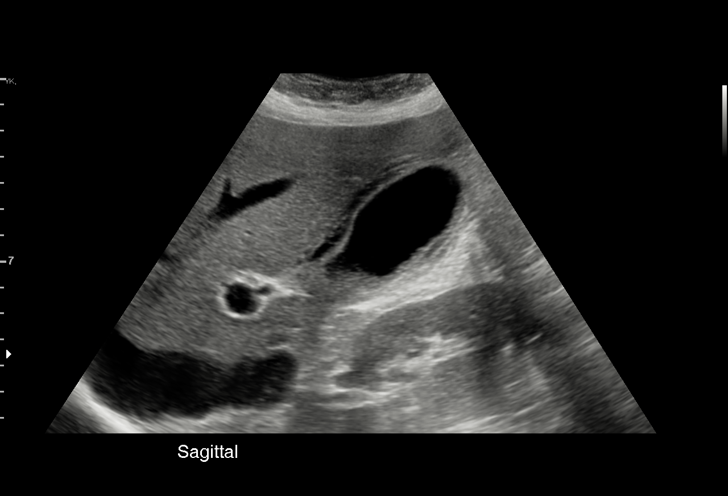
[im 47/81]
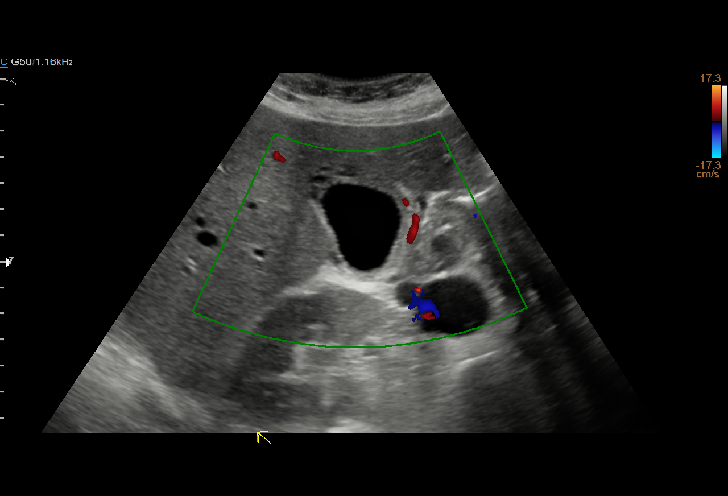
[im 51/81]
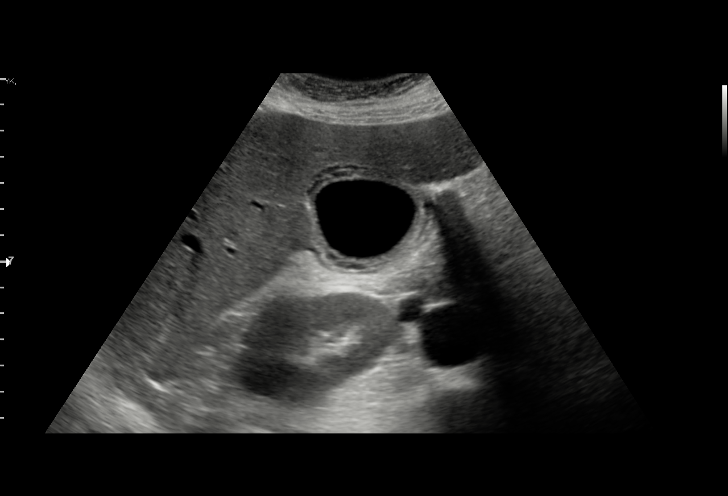
[im 57/81]
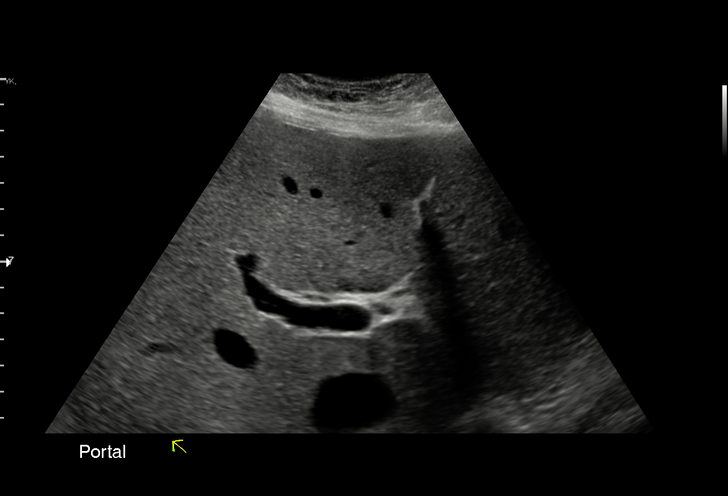
[im 64/81]
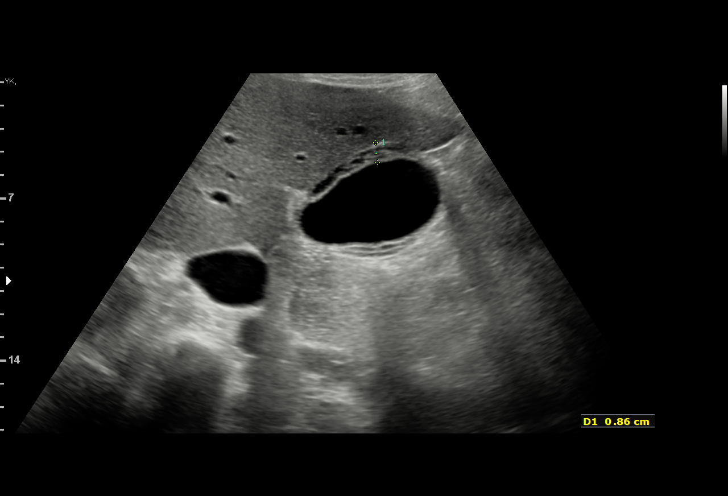
[im 67/81]
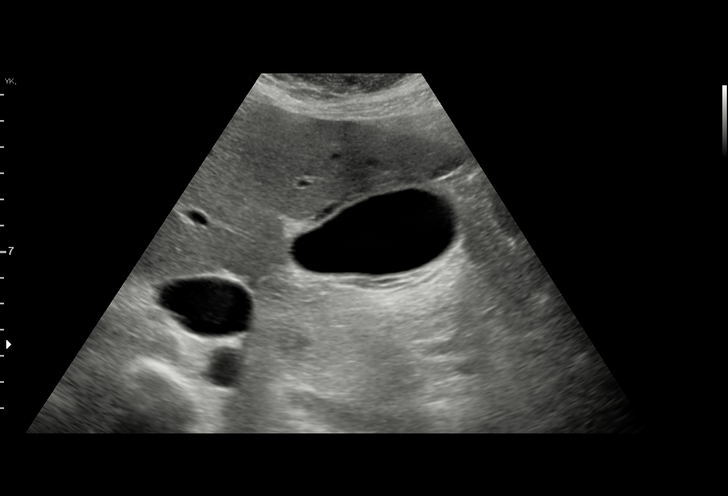
[im 74/81]
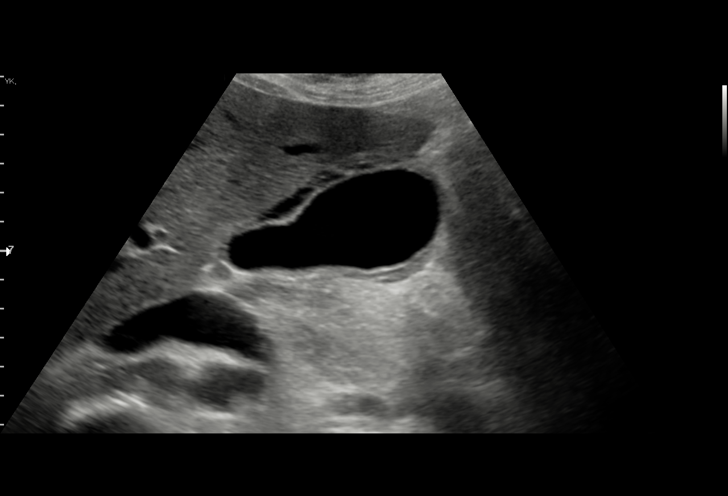
[im 81/81]
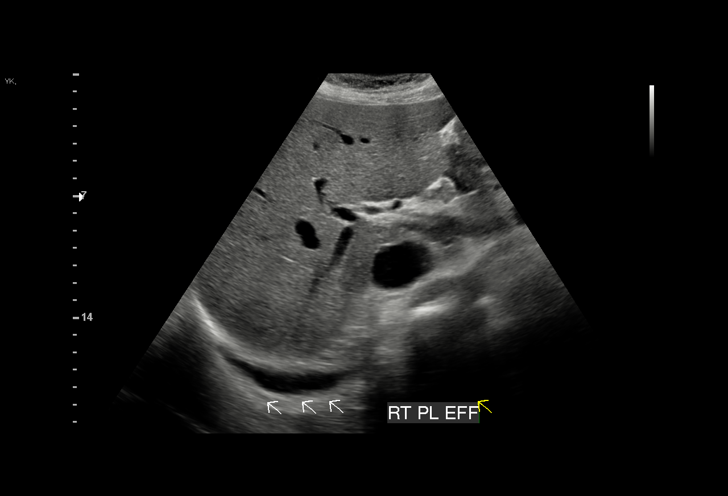

[15 of 25 positions shown; findings below may reference images not displayed]

FINDINGS: Gallbladder:

No cholelithiasis. The gallbladder wall is thickened measuring 8 mm.
Wall is edematous with small amount of pericholecystic fluid.
Negative sonographic Murphy sign.

Common bile duct:

Diameter: 4.7 mm

Liver:

No focal lesion identified. Within normal limits in parenchymal
echogenicity. Portal vein is patent on color Doppler imaging with
normal direction of blood flow towards the liver.

Other: Small right pleural effusion.
IMPRESSION: Nonspecific gallbladder wall thickening (7 mm) with associated edema
and pericholecystic fluid. No cholelithiasis. Possibility of acute
cholecystitis among other etiologies is not entirely excluded.
Recommend clinical and laboratory correlation.

## 2021-04-27 IMAGING — US US ABDOMEN LIMITED
1 series · 15 of 25 positions shown · non-contrast
Comparison: September 23, 2019

CLINICAL DATA: Right upper quadrant pain

EXAM:
ULTRASOUND ABDOMEN LIMITED RIGHT UPPER QUADRANT

[Series 3: us abdomen limited · 15 of 74 slices shown]
[im 1/74]
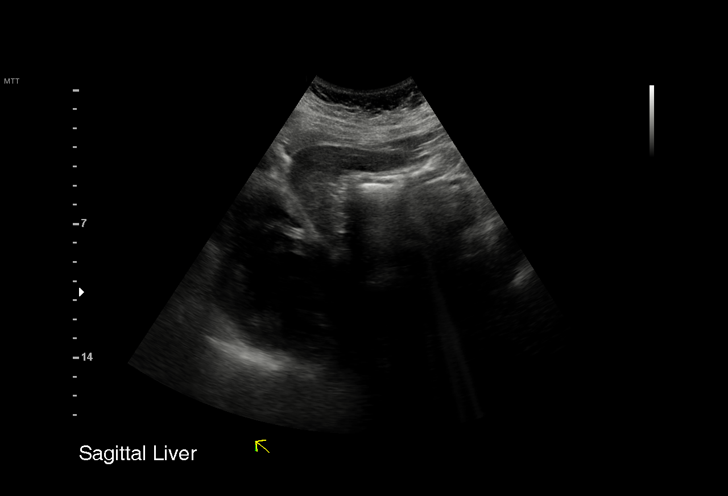
[im 7/74]
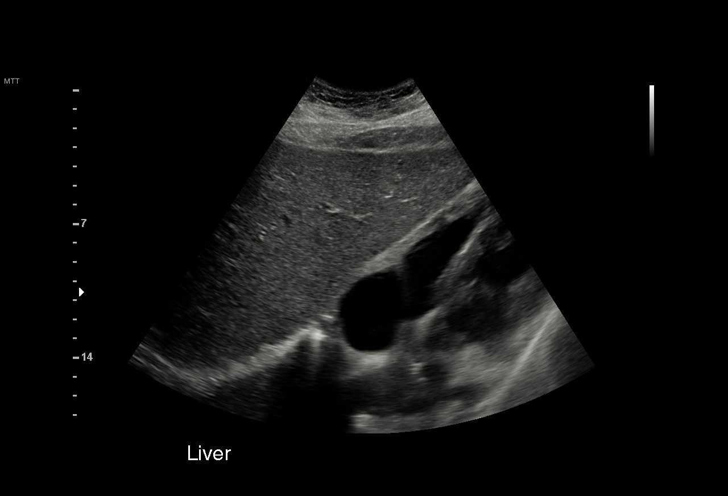
[im 13/74]
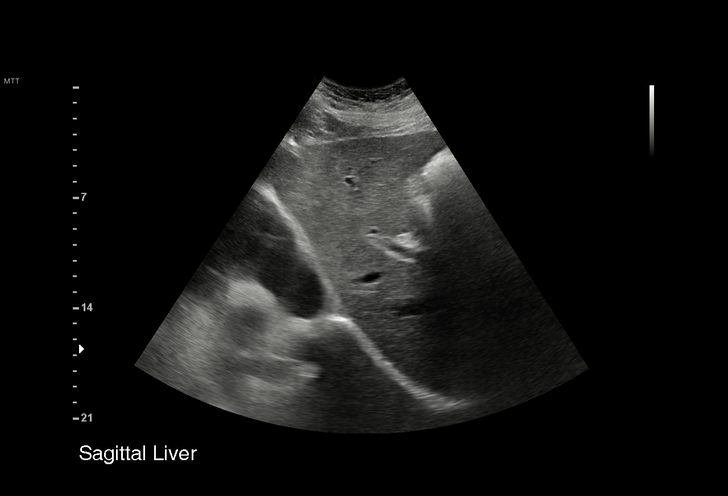
[im 16/74]
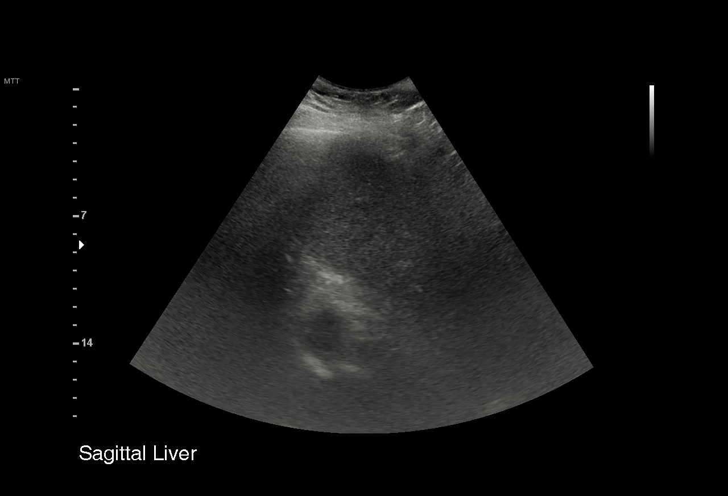
[im 22/74]
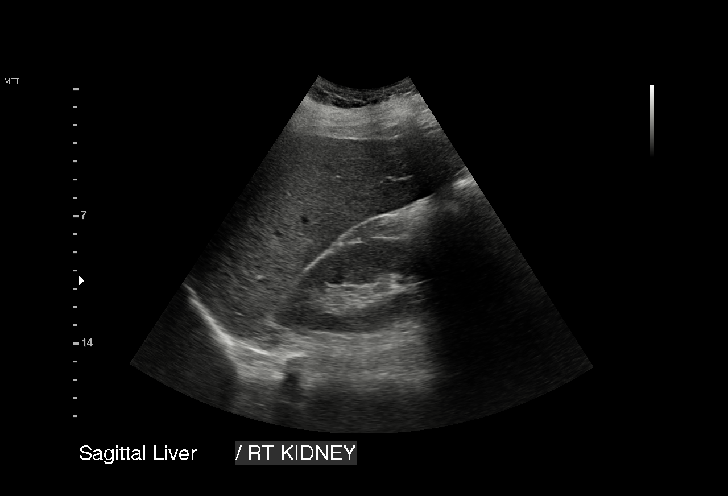
[im 28/74]
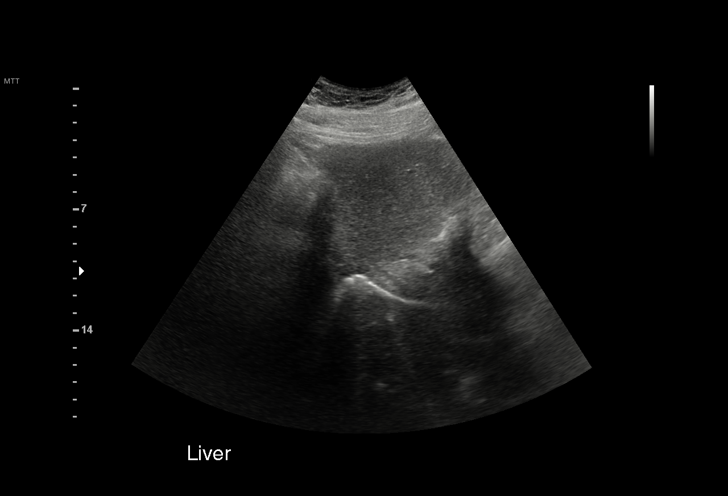
[im 31/74]
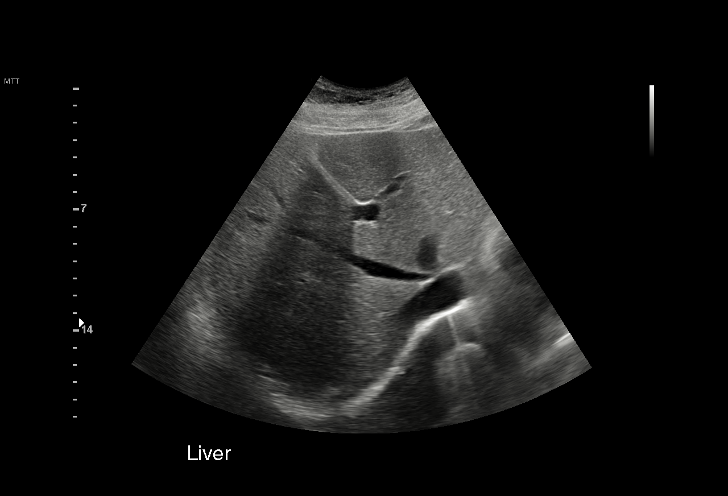
[im 37/74]
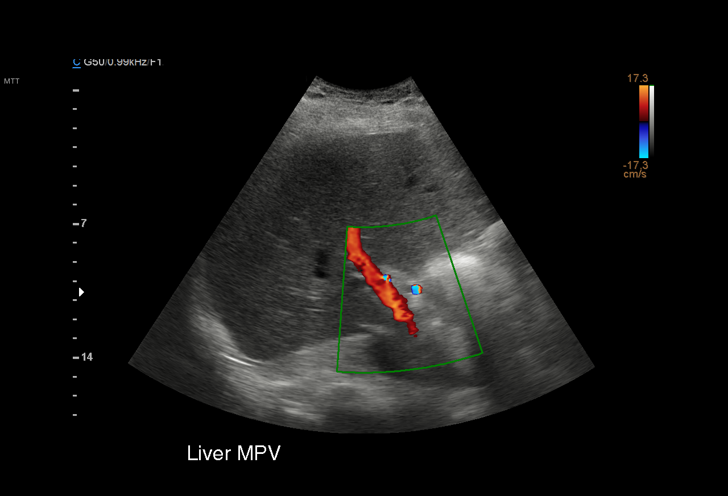
[im 43/74]
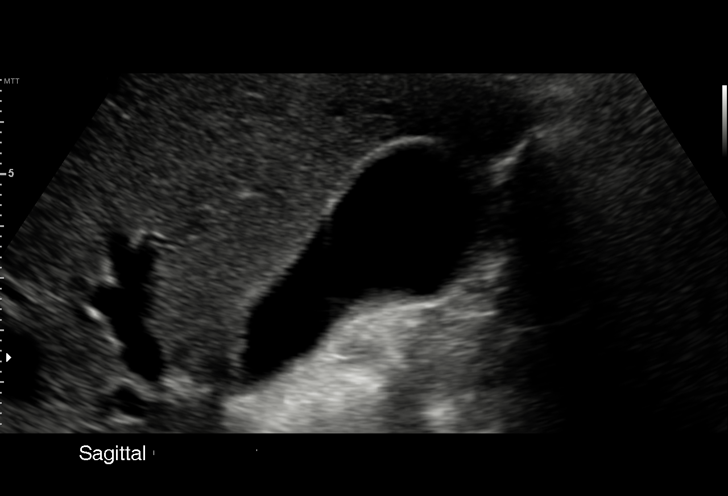
[im 46/74]
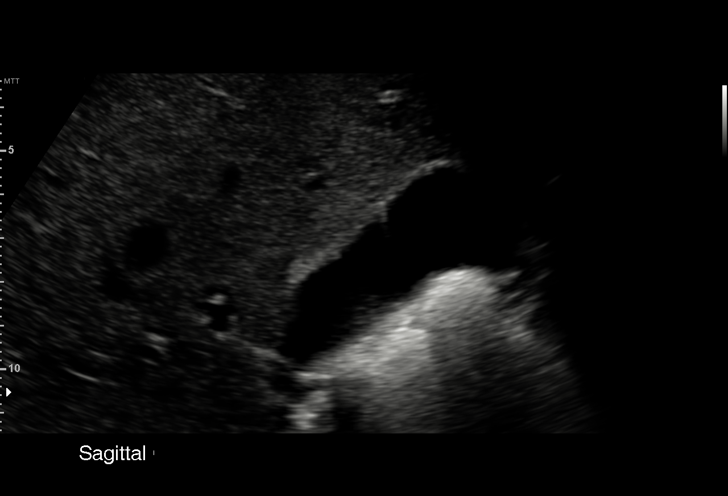
[im 52/74]
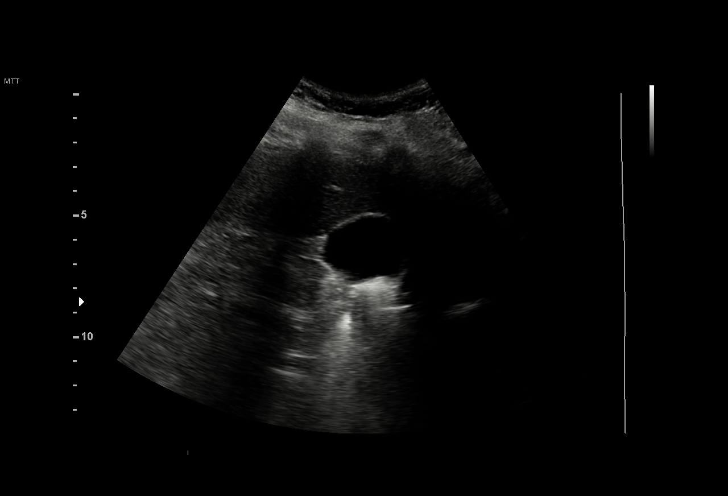
[im 58/74]
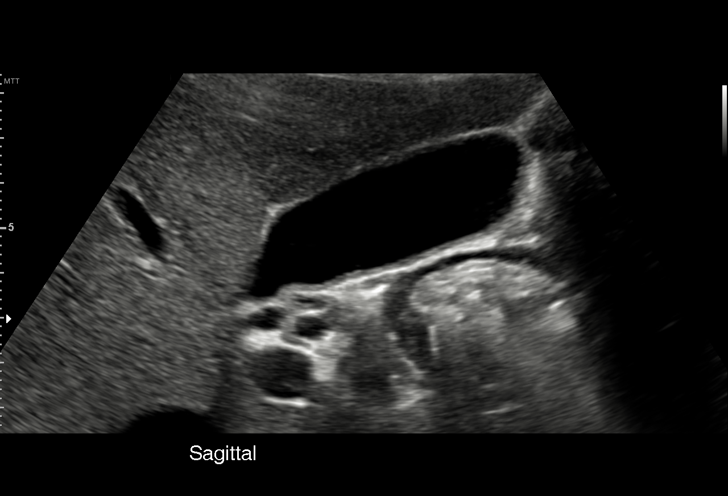
[im 61/74]
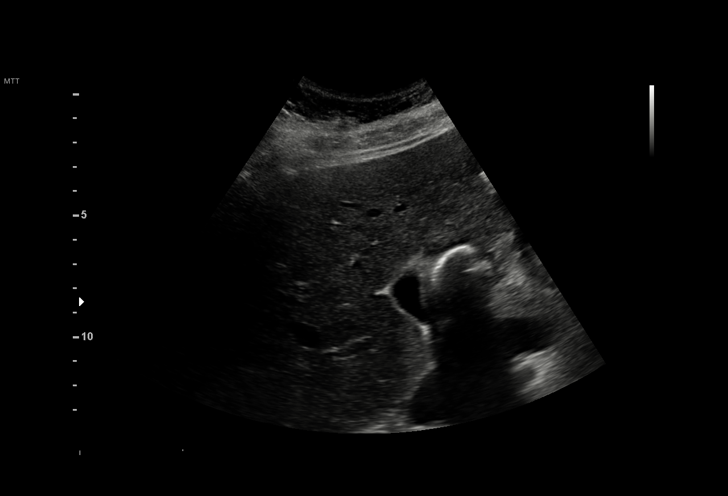
[im 67/74]
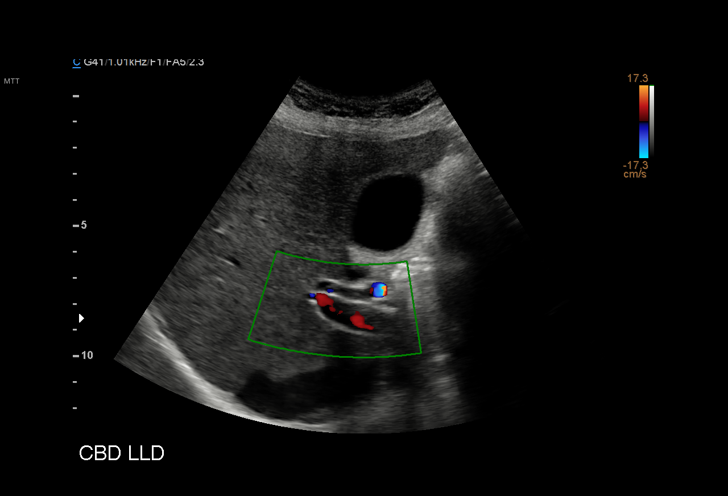
[im 74/74]
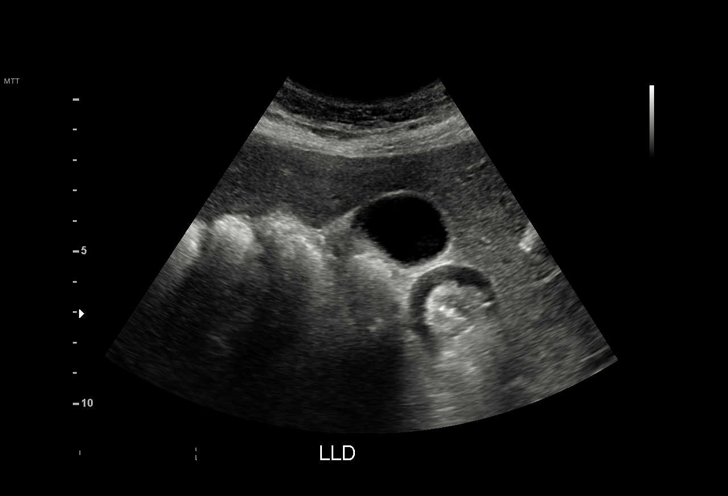

[15 of 25 positions shown; findings below may reference images not displayed]

FINDINGS: Gallbladder:

No gallstones are appreciated. There is an echogenic focus along the
anterior wall of the gallbladder with ring down type artifact. There
is no appreciable gallbladder wall thickening or pericholecystic
fluid. No sonographic Murphy sign noted by sonographer.

Common bile duct:

Diameter: 4 mm. No intrahepatic or extrahepatic biliary duct
dilatation.

Liver:

No focal lesion identified. Within normal limits in parenchymal
echogenicity. Portal vein is patent on color Doppler imaging with
normal direction of blood flow towards the liver.

Other: None.
IMPRESSION: Echogenic focus along the anterior wall of the gallbladder with ring
down type artifact. Suspect inflammatory focus such as
cholesterolosis or adenomyomatosis. No demonstrable gallstones,
gallbladder wall thickening, or pericholecystic fluid.

Study otherwise unremarkable.

## 2022-01-05 ENCOUNTER — Other Ambulatory Visit: Payer: Self-pay | Admitting: Obstetrics & Gynecology

## 2023-11-24 NOTE — Progress Notes (Signed)
Sent message, via epic in basket, requesting orders in epic from Careers adviser.

## 2023-11-29 NOTE — Patient Instructions (Signed)
DUE TO COVID-19 ONLY TWO VISITORS  (aged 43 and older)  ARE ALLOWED TO COME WITH YOU AND STAY IN THE WAITING ROOM ONLY DURING PRE OP AND PROCEDURE.   **NO VISITORS ARE ALLOWED IN THE SHORT STAY AREA OR RECOVERY ROOM!!**  IF YOU WILL BE ADMITTED INTO THE HOSPITAL YOU ARE ALLOWED ONLY FOUR SUPPORT PEOPLE DURING VISITATION HOURS ONLY (7 AM -8PM)   The support person(s) must pass our screening, gel in and out, and wear a mask at all times, including in the patient's room. Patients must also wear a mask when staff or their support person are in the room. Visitors GUEST BADGE MUST BE WORN VISIBLY  One adult visitor may remain with you overnight and MUST be in the room by 8 P.M.     Your procedure is scheduled on: 12/23/23   Report to [facility] Main Entrance    Report to admitting at : 8:00 AM   Call this number if you have problems the morning of surgery [phone]   Eat a light diet the day before surgery.  Examples including soups, broths, toast, yogurt, mashed potatoes.  Things to avoid include carbonated beverages (fizzy beverages), raw fruits and raw vegetables, or beans.   If your bowels are filled with gas, your surgeon will have difficulty visualizing your pelvic organs which increases your surgical risks.  Do not eat food :After Midnight.   After Midnight you may have the following liquids until : 7:00 AM DAY OF SURGERY  Water Black Coffee (sugar ok, NO MILK/CREAM OR CREAMERS)  Tea (sugar ok, NO MILK/CREAM OR CREAMERS) regular and decaf                             Plain Jell-O (NO RED)                                           Fruit ices (not with fruit pulp, NO RED)                                     Popsicles (NO RED)                                                                  Juice: apple, WHITE grape, WHITE cranberry Sports drinks like Gatorade (NO RED)             FOLLOW ANY ADDITIONAL PRE OP INSTRUCTIONS YOU RECEIVED FROM YOUR SURGEON'S OFFICE!!!   Oral  Hygiene is also important to reduce your risk of infection.                                    Remember - BRUSH YOUR TEETH THE MORNING OF SURGERY WITH YOUR REGULAR TOOTHPASTE  DENTURES WILL BE REMOVED PRIOR TO SURGERY PLEASE DO NOT APPLY "Poly grip" OR ADHESIVES!!!   Do NOT smoke after Midnight   Take these medicines the morning of surgery with A SIP OF WATER:  paroxetine.Tylenol as needed.                              You may not have any metal on your body including hair pins, jewelry, and body piercing             Do not wear make-up, lotions, powders, perfumes/cologne, or deodorant  Do not wear nail polish including gel and S&S, artificial/acrylic nails, or any other type of covering on natural nails including finger and toenails. If you have artificial nails, gel coating, etc. that needs to be removed by a nail salon please have this removed prior to surgery or surgery may need to be canceled/ delayed if the surgeon/ anesthesia feels like they are unable to be safely monitored.   Do not shave  48 hours prior to surgery.    Do not bring valuables to the hospital. [facility] IS NOT             RESPONSIBLE   FOR VALUABLES.   Contacts, glasses, or bridgework may not be worn into surgery.   Bring small overnight bag day of surgery.   DO NOT BRING YOUR HOME MEDICATIONS TO THE HOSPITAL. PHARMACY WILL DISPENSE MEDICATIONS LISTED ON YOUR MEDICATION LIST TO YOU DURING YOUR ADMISSION IN THE HOSPITAL!    Patients discharged on the day of surgery will not be allowed to drive home.  Someone NEEDS to stay with you for the first 24 hours after anesthesia.   Special Instructions: Bring a copy of your healthcare power of attorney and living will documents         the day of surgery if you haven't scanned them before.              Please read over the following fact sheets you were given: IF YOU HAVE QUESTIONS ABOUT YOUR PRE-OP INSTRUCTIONS PLEASE CALL [phone]    [facility] - Preparing for  Surgery Before surgery, you can play an important role.  Because skin is not sterile, your skin needs to be as free of germs as possible.  You can reduce the number of germs on your skin by washing with CHG (chlorahexidine gluconate) soap before surgery.  CHG is an antiseptic cleaner which kills germs and bonds with the skin to continue killing germs even after washing. Please DO NOT use if you have an allergy to CHG or antibacterial soaps.  If your skin becomes reddened/irritated stop using the CHG and inform your nurse when you arrive at Short Stay. Do not shave (including legs and underarms) for at least 48 hours prior to the first CHG shower.  You may shave your face/neck. Please follow these instructions carefully:  1.  Shower with CHG Soap the night before surgery and the  morning of Surgery.  2.  If you choose to wash your hair, wash your hair first as usual with your  normal  shampoo.  3.  After you shampoo, rinse your hair and body thoroughly to remove the  shampoo.                           4.  Use CHG as you would any other liquid soap.  You can apply chg directly  to the skin and wash                       Gently with a scrungie  or clean washcloth.  5.  Apply the CHG Soap to your body ONLY FROM THE NECK DOWN.   Do not use on face/ open                           Wound or open sores. Avoid contact with eyes, ears mouth and genitals (private parts).                       Wash face,  Genitals (private parts) with your normal soap.             6.  Wash thoroughly, paying special attention to the area where your surgery  will be performed.  7.  Thoroughly rinse your body with warm water from the neck down.  8.  DO NOT shower/wash with your normal soap after using and rinsing off  the CHG Soap.                9.  Pat yourself dry with a clean towel.            10.  Wear clean pajamas.            11.  Place clean sheets on your bed the night of your first shower and do not  sleep with pets. Day  of Surgery : Do not apply any lotions/deodorants the morning of surgery.  Please wear clean clothes to the hospital/surgery center.  FAILURE TO FOLLOW THESE INSTRUCTIONS MAY RESULT IN THE CANCELLATION OF YOUR SURGERY PATIENT SIGNATURE_________________________________  NURSE SIGNATURE__________________________________  ________________________________________________________________________

## 2023-12-01 ENCOUNTER — Encounter (HOSPITAL_COMMUNITY)
Admission: RE | Admit: 2023-12-01 | Discharge: 2023-12-01 | Disposition: A | Discharge status: Home / Self Care | Payer: BC Managed Care – PPO | Source: Ambulatory Visit | Attending: Obstetrics & Gynecology | Admitting: Obstetrics & Gynecology

## 2023-12-01 ENCOUNTER — Other Ambulatory Visit: Payer: Self-pay

## 2023-12-01 ENCOUNTER — Encounter (HOSPITAL_COMMUNITY): Payer: Self-pay | Admitting: *Deleted

## 2023-12-01 VITALS — BP 149/60 | HR 69 | Temp 98.2°F | Ht 67.0 in | Wt 255.0 lb

## 2023-12-01 DIAGNOSIS — Z01818 Encounter for other preprocedural examination: Secondary | ICD-10-CM | POA: Diagnosis present

## 2023-12-01 DIAGNOSIS — R001 Bradycardia, unspecified: Secondary | ICD-10-CM | POA: Diagnosis not present

## 2023-12-01 HISTORY — DX: Angina pectoris, unspecified: I20.9

## 2023-12-01 HISTORY — DX: Post-traumatic stress disorder, unspecified: F43.10

## 2023-12-01 HISTORY — DX: Anxiety disorder, unspecified: F41.9

## 2023-12-01 HISTORY — DX: Bradycardia, unspecified: R00.1

## 2023-12-01 LAB — CBC
HCT: 35.2 % — ABNORMAL LOW (ref 36.0–46.0)
Hemoglobin: 11.2 g/dL — ABNORMAL LOW (ref 12.0–15.0)
MCH: 29.4 pg (ref 26.0–34.0)
MCHC: 31.8 g/dL (ref 30.0–36.0)
MCV: 92.4 fL (ref 80.0–100.0)
Platelets: 302 10*3/uL (ref 150–400)
RBC: 3.81 MIL/uL — ABNORMAL LOW (ref 3.87–5.11)
RDW: 12.7 % (ref 11.5–15.5)
WBC: 6.5 10*3/uL (ref 4.0–10.5)
nRBC: 0 % (ref 0.0–0.2)

## 2023-12-01 LAB — BASIC METABOLIC PANEL
Anion gap: 9 (ref 5–15)
BUN: 14 mg/dL (ref 6–20)
CO2: 24 mmol/L (ref 22–32)
Calcium: 8.7 mg/dL — ABNORMAL LOW (ref 8.9–10.3)
Chloride: 105 mmol/L (ref 98–111)
Creatinine, Ser: 0.36 mg/dL — ABNORMAL LOW (ref 0.44–1.00)
GFR, Estimated: >60 mL/min (ref >60)
Glucose, Bld: 99 mg/dL (ref 70–99)
Potassium: 4.3 mmol/L (ref 3.5–5.1)
Sodium: 138 mmol/L (ref 135–145)

## 2023-12-01 NOTE — Progress Notes (Addendum)
 For Anesthesia: PCP - Ofilia Lamar CROME, MD . ARNETTA: 07/29/23 Cardiologist - Pietro Redell RAMAN, MD . LOV: 09/24/19  Bowel Prep reminder:  Chest x-ray -  EKG - 12/01/23 Stress Test -  ECHO - 09/23/19 Cardiac Cath -  Pacemaker/ICD device last checked: Pacemaker orders received: Device Rep notified:  Spinal Cord Stimulator:N/A  Sleep Study - N/A CPAP -   Fasting Blood Sugar - N/A Checks Blood Sugar _____ times a day Date and result of last Hgb A1c-  Last dose of GLP1 agonist- N/A GLP1 instructions:   Last dose of SGLT-2 inhibitors- N/A SGLT-2 instructions:   Blood Thinner Instructions:N/A Aspirin Instructions: Last Dose:  Activity level: Can go up a flight of stairs and activities of daily living without stopping and without chest pain and/or shortness of breath   Able to exercise without chest pain and/or shortness of breath  Anesthesia review: Hx: Bradycardia,angina.  Patient denies shortness of breath, fever, cough and chest pain at PAT appointment   Patient verbalized understanding of instructions that were given to them at the PAT appointment. Patient was also instructed that they will need to review over the PAT instructions again at home before surgery.
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
## 2023-12-23 ENCOUNTER — Ambulatory Visit (HOSPITAL_COMMUNITY)
Admission: RE | Admit: 2023-12-23 | Discharge: 2023-12-23 | Disposition: A | Discharge status: Home / Self Care | Payer: BC Managed Care – PPO | Source: Ambulatory Visit | Attending: Obstetrics & Gynecology | Admitting: Obstetrics & Gynecology

## 2023-12-23 ENCOUNTER — Other Ambulatory Visit: Payer: Self-pay

## 2023-12-23 ENCOUNTER — Ambulatory Visit (HOSPITAL_COMMUNITY): Payer: BC Managed Care – PPO | Admitting: Physician Assistant

## 2023-12-23 ENCOUNTER — Encounter (HOSPITAL_COMMUNITY)
Admission: RE | Disposition: A | Discharge status: Home / Self Care | Payer: Self-pay | Source: Ambulatory Visit | Attending: Obstetrics & Gynecology

## 2023-12-23 DIAGNOSIS — N838 Other noninflammatory disorders of ovary, fallopian tube and broad ligament: Secondary | ICD-10-CM | POA: Insufficient documentation

## 2023-12-23 DIAGNOSIS — Z01818 Encounter for other preprocedural examination: Secondary | ICD-10-CM

## 2023-12-23 DIAGNOSIS — F32A Depression, unspecified: Secondary | ICD-10-CM | POA: Insufficient documentation

## 2023-12-23 DIAGNOSIS — D649 Anemia, unspecified: Secondary | ICD-10-CM | POA: Diagnosis not present

## 2023-12-23 DIAGNOSIS — Z302 Encounter for sterilization: Secondary | ICD-10-CM | POA: Insufficient documentation

## 2023-12-23 DIAGNOSIS — F419 Anxiety disorder, unspecified: Secondary | ICD-10-CM | POA: Insufficient documentation

## 2023-12-23 DIAGNOSIS — I10 Essential (primary) hypertension: Secondary | ICD-10-CM | POA: Diagnosis not present

## 2023-12-23 HISTORY — PX: XI ROBOTIC ASSISTED SALPINGECTOMY: SHX6824

## 2023-12-23 LAB — TYPE AND SCREEN
ABO/RH(D): A NEG
Antibody Screen: NEGATIVE

## 2023-12-23 LAB — CBC
HCT: 36.4 % (ref 36.0–46.0)
Hemoglobin: 11.6 g/dL — ABNORMAL LOW (ref 12.0–15.0)
MCH: 29 pg (ref 26.0–34.0)
MCHC: 31.9 g/dL (ref 30.0–36.0)
MCV: 91 fL (ref 80.0–100.0)
Platelets: 291 10*3/uL (ref 150–400)
RBC: 4 MIL/uL (ref 3.87–5.11)
RDW: 12.7 % (ref 11.5–15.5)
WBC: 5.8 10*3/uL (ref 4.0–10.5)
nRBC: 0 % (ref 0.0–0.2)

## 2023-12-23 LAB — POCT PREGNANCY, URINE: Preg Test, Ur: NEGATIVE

## 2023-12-23 SURGERY — SALPINGECTOMY, ROBOT-ASSISTED
Anesthesia: General | Site: Abdomen | Laterality: Bilateral

## 2023-12-23 MED ORDER — PROPOFOL 10 MG/ML IV BOLUS
INTRAVENOUS | Status: AC
  Filled 2023-12-23: qty 20

## 2023-12-23 MED ORDER — EPHEDRINE 5 MG/ML INJ
INTRAVENOUS | Status: AC
  Filled 2023-12-23: qty 5

## 2023-12-23 MED ORDER — ONDANSETRON HCL 4 MG/2ML IJ SOLN
INTRAMUSCULAR | Status: DC | PRN
  Administered 2023-12-23: 4 mg via INTRAVENOUS

## 2023-12-23 MED ORDER — CHLORHEXIDINE GLUCONATE 0.12 % MT SOLN
15.0000 mL | Freq: Once | OROMUCOSAL | Status: AC
  Administered 2023-12-23: 15 mL via OROMUCOSAL

## 2023-12-23 MED ORDER — PROPOFOL 10 MG/ML IV BOLUS
INTRAVENOUS | Status: DC | PRN
  Administered 2023-12-23: 20 mg via INTRAVENOUS
  Administered 2023-12-23: 150 mg via INTRAVENOUS

## 2023-12-23 MED ORDER — OXYCODONE HCL 5 MG/5ML PO SOLN: 5.0000 mg | Freq: Once | ORAL | Status: AC | PRN

## 2023-12-23 MED ORDER — DEXAMETHASONE SODIUM PHOSPHATE 10 MG/ML IJ SOLN
INTRAMUSCULAR | Status: DC | PRN
  Administered 2023-12-23: 10 mg via INTRAVENOUS

## 2023-12-23 MED ORDER — BUPIVACAINE HCL (PF) 0.25 % IJ SOLN
INTRAMUSCULAR | Status: DC | PRN
  Administered 2023-12-23: 15 mL

## 2023-12-23 MED ORDER — FENTANYL CITRATE (PF) 100 MCG/2ML IJ SOLN
INTRAMUSCULAR | Status: AC
  Filled 2023-12-23: qty 2

## 2023-12-23 MED ORDER — LACTATED RINGERS IV SOLN: INTRAVENOUS | Status: DC

## 2023-12-23 MED ORDER — ONDANSETRON HCL 4 MG/2ML IJ SOLN: 4.0000 mg | Freq: Once | INTRAMUSCULAR | Status: DC | PRN

## 2023-12-23 MED ORDER — DEXAMETHASONE SODIUM PHOSPHATE 10 MG/ML IJ SOLN
INTRAMUSCULAR | Status: AC
  Filled 2023-12-23: qty 1

## 2023-12-23 MED ORDER — OXYCODONE HCL 5 MG PO TABS
ORAL_TABLET | ORAL | Status: DC
Start: 2023-12-23 — End: 2023-12-23
  Filled 2023-12-23: qty 1

## 2023-12-23 MED ORDER — POVIDONE-IODINE 10 % EX SWAB: 2.0000 | Freq: Once | CUTANEOUS | Status: DC

## 2023-12-23 MED ORDER — DEXMEDETOMIDINE HCL IN NACL 80 MCG/20ML IV SOLN
INTRAVENOUS | Status: DC | PRN
Start: 2023-12-23 — End: 2023-12-23
  Administered 2023-12-23 (×2): 4 ug via INTRAVENOUS
  Administered 2023-12-23: 8 ug via INTRAVENOUS

## 2023-12-23 MED ORDER — FENTANYL CITRATE PF 50 MCG/ML IJ SOSY: 25.0000 ug | PREFILLED_SYRINGE | INTRAMUSCULAR | Status: DC | PRN

## 2023-12-23 MED ORDER — ONDANSETRON HCL 4 MG/2ML IJ SOLN
INTRAMUSCULAR | Status: AC
  Filled 2023-12-23: qty 2

## 2023-12-23 MED ORDER — KETOROLAC TROMETHAMINE 30 MG/ML IJ SOLN
30.0000 mg | Freq: Four times a day (QID) | INTRAMUSCULAR | Status: DC | PRN
  Administered 2023-12-23: 30 mg via INTRAVENOUS

## 2023-12-23 MED ORDER — LIDOCAINE HCL (PF) 2 % IJ SOLN
INTRAMUSCULAR | Status: AC
  Filled 2023-12-23: qty 5

## 2023-12-23 MED ORDER — STERILE WATER FOR IRRIGATION IR SOLN
Status: DC | PRN
  Administered 2023-12-23: 1000 mL

## 2023-12-23 MED ORDER — ROCURONIUM BROMIDE 10 MG/ML (PF) SYRINGE
PREFILLED_SYRINGE | INTRAVENOUS | Status: DC | PRN
  Administered 2023-12-23: 20 mg via INTRAVENOUS
  Administered 2023-12-23: 70 mg via INTRAVENOUS

## 2023-12-23 MED ORDER — ACETAMINOPHEN 10 MG/ML IV SOLN: 1000.0000 mg | Freq: Once | INTRAVENOUS | Status: DC | PRN

## 2023-12-23 MED ORDER — EPHEDRINE SULFATE (PRESSORS) 50 MG/ML IJ SOLN
INTRAMUSCULAR | Status: DC | PRN
  Administered 2023-12-23 (×3): 5 mg via INTRAVENOUS

## 2023-12-23 MED ORDER — LIDOCAINE HCL (CARDIAC) PF 100 MG/5ML IV SOSY
PREFILLED_SYRINGE | INTRAVENOUS | Status: DC | PRN
  Administered 2023-12-23: 100 mg via INTRATRACHEAL

## 2023-12-23 MED ORDER — SUGAMMADEX SODIUM 200 MG/2ML IV SOLN
INTRAVENOUS | Status: DC | PRN
  Administered 2023-12-23: 400 mg via INTRAVENOUS

## 2023-12-23 MED ORDER — ORAL CARE MOUTH RINSE: 15.0000 mL | Freq: Once | OROMUCOSAL | Status: AC

## 2023-12-23 MED ORDER — KETOROLAC TROMETHAMINE 30 MG/ML IJ SOLN
INTRAMUSCULAR | Status: AC
  Filled 2023-12-23: qty 1

## 2023-12-23 MED ORDER — ROCURONIUM BROMIDE 10 MG/ML (PF) SYRINGE
PREFILLED_SYRINGE | INTRAVENOUS | Status: AC
  Filled 2023-12-23: qty 10

## 2023-12-23 MED ORDER — OXYCODONE HCL 5 MG PO TABS
5.0000 mg | ORAL_TABLET | Freq: Once | ORAL | Status: AC | PRN
  Administered 2023-12-23: 5 mg via ORAL

## 2023-12-23 MED ORDER — FENTANYL CITRATE (PF) 100 MCG/2ML IJ SOLN
INTRAMUSCULAR | Status: DC | PRN
  Administered 2023-12-23 (×3): 25 ug via INTRAVENOUS
  Administered 2023-12-23: 100 ug via INTRAVENOUS

## 2023-12-23 SURGICAL SUPPLY — 66 items
APPLICATOR ARISTA FLEXITIP XL (MISCELLANEOUS) IMPLANT
CANNULA CAP OBTURATR AIRSEAL 8 (CAP) ×1 IMPLANT
CATH FOLEY 3WAY 5CC 16FR (CATHETERS) ×1 IMPLANT
CELLS DAT CNTRL 66122 CELL SVR (MISCELLANEOUS) IMPLANT
COVER BACK TABLE 60X90IN (DRAPES) ×1 IMPLANT
COVER TIP SHEARS 8 DVNC (MISCELLANEOUS) ×1 IMPLANT
DEFOGGER SCOPE WARMER CLEARIFY (MISCELLANEOUS) ×1 IMPLANT
DERMABOND ADVANCED .7 DNX12 (GAUZE/BANDAGES/DRESSINGS) ×1 IMPLANT
DRAPE ARM DVNC X/XI (DISPOSABLE) ×4 IMPLANT
DRAPE COLUMN DVNC XI (DISPOSABLE) ×1 IMPLANT
DRAPE SURG IRRIG POUCH 19X23 (DRAPES) ×1 IMPLANT
DRAPE UTILITY XL STRL (DRAPES) ×1 IMPLANT
DRIVER NDL MEGA SUTCUT DVNCXI (INSTRUMENTS) ×1 IMPLANT
DRIVER NDLE MEGA SUTCUT DVNCXI (INSTRUMENTS) ×1 IMPLANT
DURAPREP 26ML APPLICATOR (WOUND CARE) ×1 IMPLANT
ELECT REM PT RETURN 15FT ADLT (MISCELLANEOUS) ×1 IMPLANT
FORCEPS BPLR FENES DVNC XI (FORCEP) ×1 IMPLANT
FORCEPS PROGRASP DVNC XI (FORCEP) IMPLANT
GAUZE 4X4 16PLY ~~LOC~~+RFID DBL (SPONGE) IMPLANT
GLOVE BIOGEL PI IND STRL 7.0 (GLOVE) ×3 IMPLANT
GLOVE SURG SS PI 6.5 STRL IVOR (GLOVE) ×3 IMPLANT
HEMOSTAT ARISTA ABSORB 3G PWDR (HEMOSTASIS) IMPLANT
HOLDER FOLEY CATH W/STRAP (MISCELLANEOUS) IMPLANT
IRRIG SUCT STRYKERFLOW 2 WTIP (MISCELLANEOUS) ×1 IMPLANT
IRRIGATION SUCT STRKRFLW 2 WTP (MISCELLANEOUS) ×1 IMPLANT
KIT TURNOVER KIT A (KITS) ×1 IMPLANT
LEGGING LITHOTOMY PAIR STRL (DRAPES) ×1 IMPLANT
MANIFOLD NEPTUNE II (INSTRUMENTS) ×1 IMPLANT
MANIPULATOR ADVINCU DEL 2.5 PL (MISCELLANEOUS) IMPLANT
MANIPULATOR ADVINCU DEL 3.0 PL (MISCELLANEOUS) IMPLANT
MANIPULATOR ADVINCU DEL 3.5 PL (MISCELLANEOUS) IMPLANT
MANIPULATOR ADVINCU DEL 4.0 PL (MISCELLANEOUS) IMPLANT
NDL INSUFFLATION 14GA 120MM (NEEDLE) ×1 IMPLANT
NEEDLE INSUFFLATION 14GA 120MM (NEEDLE) ×1 IMPLANT
OBTURATOR OPTICAL STND 8 DVNC (TROCAR) ×1 IMPLANT
OBTURATOR OPTICALSTD 8 DVNC (TROCAR) ×1 IMPLANT
OCCLUDER COLPOPNEUMO (BALLOONS) IMPLANT
PACK ROBOT WH (CUSTOM PROCEDURE TRAY) ×1 IMPLANT
PACK ROBOTIC GOWN (GOWN DISPOSABLE) ×1 IMPLANT
PAD OB MATERNITY 11 LF (PERSONAL CARE ITEMS) ×1 IMPLANT
PAD POSITIONING PINK XL (MISCELLANEOUS) ×1 IMPLANT
PAD PREP 24X48 CUFFED NSTRL (MISCELLANEOUS) ×1 IMPLANT
PROTECTOR NERVE ULNAR (MISCELLANEOUS) ×1 IMPLANT
RETRACTOR WND ALEXIS 18 MED (MISCELLANEOUS) IMPLANT
RTRCTR WOUND ALEXIS 18CM MED (MISCELLANEOUS) IMPLANT
RTRCTR WOUND ALEXIS 18CM SML (INSTRUMENTS) IMPLANT
SAVER CELL AAL HAEMONETICS (INSTRUMENTS) IMPLANT
SCISSORS MNPLR CVD DVNC XI (INSTRUMENTS) ×1 IMPLANT
SEAL UNIV 5-12 XI (MISCELLANEOUS) ×2 IMPLANT
SEALER VESSEL EXT DVNC XI (MISCELLANEOUS) IMPLANT
SET IRRIG Y TYPE TUR BLADDER L (SET/KITS/TRAYS/PACK) IMPLANT
SET TUBE FILTERED XL AIRSEAL (SET/KITS/TRAYS/PACK) ×1 IMPLANT
SLEEVE SCD COMPRESS KNEE MED (STOCKING) ×1 IMPLANT
SPIKE FLUID TRANSFER (MISCELLANEOUS) ×2 IMPLANT
SUT VIC AB 0 CT1 27XBRD ANBCTR (SUTURE) IMPLANT
SUT VIC AB 4-0 PS2 18 (SUTURE) ×2 IMPLANT
SUT VICRYL 0 UR6 27IN ABS (SUTURE) IMPLANT
SUT VLOC 180 0 9IN GS21 (SUTURE) ×1 IMPLANT
TIP RUMI ORANGE 6.7MMX12CM (TIP) IMPLANT
TIP UTERINE 5.1X6CM LAV DISP (MISCELLANEOUS) IMPLANT
TIP UTERINE 6.7X10CM GRN DISP (MISCELLANEOUS) IMPLANT
TIP UTERINE 6.7X6CM WHT DISP (MISCELLANEOUS) IMPLANT
TIP UTERINE 6.7X8CM BLUE DISP (MISCELLANEOUS) IMPLANT
TOWEL OR 17X24 6PK STRL BLUE (TOWEL DISPOSABLE) ×1 IMPLANT
TROCAR PORT AIRSEAL 8X100 (TROCAR) IMPLANT
WATER STERILE IRR 1000ML POUR (IV SOLUTION) ×1 IMPLANT

## 2023-12-23 NOTE — Op Note (Addendum)
 Patient: Sharon Mckay DOB: 1981-10-21 MRN: [id number] Date of procedure: 12/23/2023  PREOP DIAGNOSIS:  39. 43 year old Para 4  female who desires permanent sterilization.      Post operative diagnosis:  Same as above.        Procedure:  Robot assisted laparoscopic bilateral salpingectomy.   Surgeon: Dr. Hoover Browns.  Assistant: Lynford Citizen, RNFA.   SURGEON ATTESTATION: I was present and scrubbed for the entire case.  An experienced assistant was required given the standard of surgical care given the complexity of the case.  This assistant was needed for exposure, retraction, instrument exchange and for overall help during the surgery.    Anesthesia: General ETA, Dr.Buck.   Complications: None.  Findings: Normal uterus, normal left and right ovaries , normal left and right fallopian tubes. Normal bowels.   IV Fluids: 1 L LR.    Urine: 500 cc.   EBL: 3 cc.   Indications: 43 y/o female Para 4 who desired permanent sterilization via bilateral salpingectomy.    Procedure: Informed consent was obtained from the patient to undergo the procedure after discussing risks that include but not limited to risks of bleeding, infection and damage to organs.  She was taken to the operating room and anesthesia was administered.  She was carefully positioned in the operating room table in dorsal lithotomy position with both arms tucked to her sides and on the Pink Trendelenburg Pad.  An exam done showed a small anteverted uterus and a closed cervix.  There were no palpable adnexal masses.   Abdomen was prepped with duraprep and perineum and vagina was prepped with betadine. She was  draped in the usual sterile fashion. A 16 french foley catheter was placed in.  A Graves speculum was placed into vagina.  Tenaculum was placed at anterior cervix.  The Hulka manipulator was placed into cervix.  Patient was placed in low dorsal lithotomy position.  Attention was then turned to the abdomen.  0.25% marcaine  (total used for the procedure was 15 cc) was injected in infraumbilical area and 3 mm incision made. Veress needle was placed in and correct placement was confirmed with irrigation, suction and hanging drop test.   Gas was instilled with low opening pressures noted and to a maximum pressure of 15 mm Hg.  The incision was extended for an 8 mm port.  The 8 mm robotic trocar was placed with direct visualization with the 0 degree scope.  Upon entry there was no notable injury to organs or vessels.   She was placed in T-burg position and maximum gas pressure was set to 15 mm Hg.  Two other side ports were placed on the left and right side of the abdomen under direct visualization for robot arms 1 and 3, lateral and a little below the infraumbilical incision, about 8 cm from umbilicus bilaterally.  The robot was docked.  The infraumbilical port was docked, 30 degree robot camera was placed in, targeting performed then the other ports were docked: Bipolar grasper was placed in Arm 1, Camera in arm 2, vessel sealer in arm 3.  The abdomen and pelvis were surveyed with the above findings.  From the fimbria to the cornual end the right fallopian tube was sealed then excised with the vessel sealer and placed in anterior cul de sac.  The left fallopian tube was similarly sealed and excised.  The specimens were delivered under direct visualization through arm 3 by the assistant using a laparoscopic grasper after removal  of the vessel sealer.  Good hemostasis was noted on the remaining pedicles.  Gas pressures were dropped to 10 mm Hg and excellent hemostasis was maintained.  The long bipolar grasper was then removed with direct visualization.  The [employer] was undocked.  Gas was turned off and was allowed to escape from the cavity, then the ports were removed under visualization with the 0 degree 5 mm camera.  Patient received five manual inflations from the anesthesia.  She was then taken out of Trendelenburg.  The skin  incisions were closed with 4-0 monocryl.  Dermabond was then applied over the skin incisions.  Attention was then turned to perineum.  Speculum was placed in and Hulka manipulator and tenaculum removed.  Hemostasis on the cervix was achieved with pressure.  Patient was then cleaned and taken to the recovery room in stable condition.  Specimens: Left and right fallopian tubes to pathology.   Dr. Hoover Browns. 12/23/2023.

## 2023-12-23 NOTE — Anesthesia Postprocedure Evaluation (Signed)
 Anesthesia Post Note  Patient: Sharon Mckay  Procedure(s) Performed: XI ROBOTIC ASSISTED SALPINGECTOMY (Bilateral: Abdomen)     Patient location during evaluation: PACU Anesthesia Type: General Level of consciousness: awake and alert Pain management: pain level controlled Vital Signs Assessment: post-procedure vital signs reviewed and stable Respiratory status: spontaneous breathing, nonlabored ventilation, respiratory function stable and patient connected to nasal cannula oxygen Cardiovascular status: blood pressure returned to baseline and stable Postop Assessment: no apparent nausea or vomiting Anesthetic complications: no   No notable events documented.  Last Vitals:  Vitals:   12/23/23 1250 12/23/23 1300  BP: 139/89 116/70  Pulse: (!) 106 98  Resp: 11 15  Temp: 36.9 C   SpO2: 100% 97%    Last Pain:  Vitals:   12/23/23 1345  TempSrc:   PainSc: 5                  [place]

## 2023-12-23 NOTE — Interval H&P Note (Signed)
 History and Physical Interval Note:  12/23/2023 10:11 AM  Sharon Mckay  has presented today for surgery, with the diagnosis of Sterlization.  The various methods of treatment have been discussed with the patient and family. After consideration of risks, benefits and other options for treatment, the patient has consented to  Procedure(s): XI ROBOTIC ASSISTED SALPINGECTOMY (Bilateral) as a surgical intervention.  The patient's history has been reviewed, patient examined, no change in status, stable for surgery.  I have reviewed the patient's chart and labs.  Questions were answered to the patient's satisfaction.     Prescilla Sours, MD.

## 2023-12-23 NOTE — Anesthesia Procedure Notes (Signed)
 Procedure Name: Intubation Date/Time: 12/23/2023 11:11 AM  Performed by: Hulan Fess, CRNAPre-anesthesia Checklist: Patient identified, Emergency Drugs available, Suction available and Patient being monitored Patient Re-evaluated:Patient Re-evaluated prior to induction Oxygen Delivery Method: Circle System Utilized Preoxygenation: Pre-oxygenation with 100% oxygen Induction Type: IV induction Ventilation: Mask ventilation without difficulty Laryngoscope Size: Mac and 3 Grade View: Grade I Tube type: Oral Tube size: 7.0 mm Number of attempts: 1 Airway Equipment and Method: Stylet and Oral airway Placement Confirmation: ETT inserted through vocal cords under direct vision, positive ETCO2 and breath sounds checked- equal and bilateral Secured at: 22 cm Tube secured with: Tape Dental Injury: Teeth and Oropharynx as per pre-operative assessment

## 2023-12-23 NOTE — Transfer of Care (Signed)
 Immediate Anesthesia Transfer of Care Note  Patient: Sharon Mckay  Procedure(s) Performed: XI ROBOTIC ASSISTED SALPINGECTOMY (Bilateral: Abdomen)  Patient Location: PACU  Anesthesia Type:General  Level of Consciousness: awake, alert , and oriented  Airway & Oxygen Therapy: Patient Spontanous Breathing and Patient connected to face mask oxygen  Post-op Assessment: Report given to RN and Post -op Vital signs reviewed and stable  Post vital signs: Reviewed and stable  Last Vitals:  Vitals Value Taken Time  BP 139/89 12/23/23 1250  Temp    Pulse 95 12/23/23 1253  Resp 16 12/23/23 1253  SpO2 100 % 12/23/23 1253  Vitals shown include unfiled device data.  Last Pain:  Vitals:   12/23/23 0828  TempSrc: Oral  PainSc: 0-No pain      Patients Stated Pain Goal: 4 (12/23/23 4401)  Complications: No notable events documented.

## 2023-12-23 NOTE — Discharge Instructions (Addendum)
 [name],  You may shower as usual, keeping water exposure over the abdomen to a minimal and pat the incisions dry after showering.      You may see a sticky substance over the incisions, this is glue used for the incisions and will fall off by itself.   3.  No tampons or douching or vaginal intercourse after the procedure until at least two weeks after the surgery and after having been seen at the office.     4. Expect some light to moderate vaginal bleeding for the next 2 to 7 days but do let me know if with heavy bleeding requiring you to change a pad every two hours or less.      5. You may have some back pain because of the gas used during the procedure, this should improve with time as you ambulate regularly.   6. Please ambulate regularly at least 1 hour every day in addition to your activities of daily living.   7.  You may see bruising around and near the incisions, this  will usually improve and resolve over time.  Let me know however if it is expanding or worsening.   8.  Call me if with excessive pain not improving with medication use, excessive vaginal bleeding, nausea, vomiting, fevers or chills or any other concerns.    9. Use pain medication of tylenol over the counter as well as ordered ibuprofen for pain as needed. Please note that Ibuprofen may increase your risk of stomach ulcers. Please ensure that you take ibuprofen after a meal and look out for symptoms of stomach ulcers such as uncontrolled pain in epigastric region. Stop the ibuprofen if with such symptoms.   10.  Do not do any heavy lifting for the next 2 weeks, I.e nothing more then 30 lbs.   11. You may return to work between 2 days to 2 weeks depending on how you feel after the surgery.     I wish you a quick recovery,  Dr. Sallye Ober Phone: [phone] extension [phone] if regular business hours.   If After regular hours: call  office number and press the extension to speak to provider on call.

## 2023-12-23 NOTE — Anesthesia Preprocedure Evaluation (Signed)
 Anesthesia Evaluation  Patient identified by MRN, date of birth, ID band Patient awake    Reviewed: Allergy & Precautions, NPO status , Patient's Chart, lab work & pertinent test results, reviewed documented beta blocker date and time   History of Anesthesia Complications Negative for: history of anesthetic complications  Airway Mallampati: II  TM Distance: >3 FB Neck ROM: Full    Dental no notable dental hx.    Pulmonary neg COPD   breath sounds clear to auscultation       Cardiovascular hypertension, + angina  (-) CAD, (-) Past MI, (-) Cardiac Stents and (-) CHF  Rhythm:Regular Rate:Normal     Neuro/Psych  Headaches PSYCHIATRIC DISORDERS Anxiety Depression       GI/Hepatic hiatal hernia,,,(+) neg Cirrhosis        Endo/Other    Renal/GU Renal disease     Musculoskeletal   Abdominal   Peds  Hematology  (+) Blood dyscrasia, anemia   Anesthesia Other Findings   Reproductive/Obstetrics                              Anesthesia Physical Anesthesia Plan  ASA: 2  Anesthesia Plan: General   Post-op Pain Management:    Induction: Intravenous  PONV Risk Score and Plan: 2 and Ondansetron and Dexamethasone  Airway Management Planned: Oral ETT  Additional Equipment:   Intra-op Plan:   Post-operative Plan: Extubation in OR  Informed Consent: I have reviewed the patients History and Physical, chart, labs and discussed the procedure including the risks, benefits and alternatives for the proposed anesthesia with the patient or authorized representative who has indicated his/her understanding and acceptance.     Dental advisory given  Plan Discussed with: CRNA  Anesthesia Plan Comments:          Anesthesia Quick Evaluation

## 2023-12-24 ENCOUNTER — Encounter (HOSPITAL_COMMUNITY): Payer: Self-pay | Admitting: Obstetrics & Gynecology

## 2023-12-24 LAB — SURGICAL PATHOLOGY

## 2024-07-14 ENCOUNTER — Other Ambulatory Visit: Payer: Self-pay | Admitting: Family Medicine

## 2024-07-14 ENCOUNTER — Ambulatory Visit
Admission: RE | Admit: 2024-07-14 | Discharge: 2024-07-14 | Disposition: A | Discharge status: Home / Self Care | Source: Ambulatory Visit | Attending: Family Medicine | Admitting: Family Medicine

## 2024-07-14 ENCOUNTER — Encounter: Payer: Self-pay | Admitting: Family Medicine

## 2024-07-14 DIAGNOSIS — R1030 Lower abdominal pain, unspecified: Secondary | ICD-10-CM

## 2024-07-14 MED ORDER — IOPAMIDOL (ISOVUE-370) INJECTION 76%
90.0000 mL | Freq: Once | INTRAVENOUS | Status: AC | PRN
  Administered 2024-07-14: 90 mL via INTRAVENOUS
